# Patient Record
Sex: Male | Born: 1990 | Race: White | Hispanic: No | Marital: Married | Smoking: Current every day smoker
Health system: Southern US, Community
[De-identification: ages and names within clinical notes are randomized; demographics above are authoritative.]

---

## 2013-12-12 DIAGNOSIS — Z8249 Family history of ischemic heart disease and other diseases of the circulatory system: Secondary | ICD-10-CM | POA: Insufficient documentation

## 2013-12-12 DIAGNOSIS — F909 Attention-deficit hyperactivity disorder, unspecified type: Secondary | ICD-10-CM | POA: Insufficient documentation

## 2013-12-12 DIAGNOSIS — Z801 Family history of malignant neoplasm of trachea, bronchus and lung: Secondary | ICD-10-CM | POA: Insufficient documentation

## 2013-12-12 DIAGNOSIS — F17200 Nicotine dependence, unspecified, uncomplicated: Secondary | ICD-10-CM | POA: Insufficient documentation

## 2013-12-12 DIAGNOSIS — F32A Depression, unspecified: Secondary | ICD-10-CM | POA: Insufficient documentation

## 2018-09-17 ENCOUNTER — Other Ambulatory Visit: Payer: Self-pay

## 2018-09-17 ENCOUNTER — Emergency Department
Admission: EM | Admit: 2018-09-17 | Discharge: 2018-09-18 | Disposition: A | Discharge status: Home / Self Care | Payer: No Typology Code available for payment source | Attending: Student in an Organized Health Care Education/Training Program | Admitting: Student in an Organized Health Care Education/Training Program

## 2018-09-17 ENCOUNTER — Emergency Department: Payer: No Typology Code available for payment source

## 2018-09-17 DIAGNOSIS — Y9389 Activity, other specified: Secondary | ICD-10-CM | POA: Insufficient documentation

## 2018-09-17 DIAGNOSIS — Z23 Encounter for immunization: Secondary | ICD-10-CM | POA: Diagnosis not present

## 2018-09-17 DIAGNOSIS — S01312A Laceration without foreign body of left ear, initial encounter: Secondary | ICD-10-CM | POA: Insufficient documentation

## 2018-09-17 DIAGNOSIS — Y929 Unspecified place or not applicable: Secondary | ICD-10-CM | POA: Insufficient documentation

## 2018-09-17 DIAGNOSIS — Y999 Unspecified external cause status: Secondary | ICD-10-CM | POA: Diagnosis not present

## 2018-09-17 DIAGNOSIS — S0990XA Unspecified injury of head, initial encounter: Secondary | ICD-10-CM | POA: Insufficient documentation

## 2018-09-17 MED ORDER — TETANUS-DIPHTH-ACELL PERTUSSIS 5-2.5-18.5 LF-MCG/0.5 IM SUSP
0.5000 mL | Freq: Once | INTRAMUSCULAR | Status: AC
  Administered 2018-09-17: 0.5 mL via INTRAMUSCULAR
  Filled 2018-09-17: qty 0.5

## 2018-09-17 MED ORDER — LIDOCAINE-EPINEPHRINE-TETRACAINE (LET) SOLUTION
3.0000 mL | Freq: Once | NASAL | Status: AC
  Administered 2018-09-17: 3 mL via TOPICAL
  Filled 2018-09-17: qty 3

## 2018-09-17 MED ORDER — BUPIVACAINE HCL (PF) 0.5 % IJ SOLN
INTRAMUSCULAR | Status: AC
  Filled 2018-09-17: qty 30
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
## 2018-09-17 NOTE — ED Notes (Signed)
Pt returned to ED Rm 8 from CT at this time. C-collar remains in place.
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
## 2018-09-18 MED ORDER — NAPROXEN 500 MG PO TABS
500.0000 mg | ORAL_TABLET | Freq: Once | ORAL | Status: AC
  Administered 2018-09-18: 500 mg via ORAL
  Filled 2018-09-18: qty 1

## 2018-09-18 MED ORDER — LEVOFLOXACIN 500 MG PO TABS: 500.0000 mg | ORAL_TABLET | Freq: Every day | ORAL | 0 refills | Status: AC

## 2018-09-18 MED ORDER — HYDROCODONE-ACETAMINOPHEN 5-325 MG PO TABS
1.0000 | ORAL_TABLET | Freq: Once | ORAL | Status: AC
  Administered 2018-09-18: 1 via ORAL
  Filled 2018-09-18: qty 1

## 2018-09-18 MED ORDER — BUTALBITAL-APAP-CAFFEINE 50-325-40 MG PO TABS: 1.0000 | ORAL_TABLET | Freq: Four times a day (QID) | ORAL | 0 refills | Status: DC | PRN

## 2018-11-13 ENCOUNTER — Emergency Department (HOSPITAL_COMMUNITY): Payer: Self-pay

## 2018-11-13 ENCOUNTER — Other Ambulatory Visit: Payer: Self-pay

## 2018-11-13 ENCOUNTER — Emergency Department (HOSPITAL_COMMUNITY)
Admission: EM | Admit: 2018-11-13 | Discharge: 2018-11-13 | Disposition: A | Discharge status: Home / Self Care | Payer: Self-pay | Attending: Emergency Medicine | Admitting: Emergency Medicine

## 2018-11-13 DIAGNOSIS — J019 Acute sinusitis, unspecified: Secondary | ICD-10-CM | POA: Insufficient documentation

## 2018-11-13 LAB — MONONUCLEOSIS SCREEN: MONO SCREEN: NEGATIVE

## 2018-11-13 MED ORDER — AMOXICILLIN 500 MG PO CAPS: 500.0000 mg | ORAL_CAPSULE | Freq: Three times a day (TID) | ORAL | 0 refills | Status: DC

## 2018-11-13 NOTE — Discharge Instructions (Signed)
Return if any problems.

## 2018-11-13 NOTE — ED Triage Notes (Signed)
Pt presents to ED for evaluation of cough, fever, excessive sweating, nasal congestion, and back pain x 2 weeks. Been around others with similar symptoms recently.

## 2018-11-13 NOTE — ED Notes (Signed)
Patient verbalized understanding of discharge instructions and denies any further needs or questions at this time. VS stable. Patient ambulatory with steady gait.  

## 2018-11-13 NOTE — ED Provider Notes (Signed)
[facility] EMERGENCY DEPARTMENT Provider Note   CSN: [id number] Arrival date & time: 11/13/18  1439     History   Chief Complaint No chief complaint on file.   HPI Randy Huff is a 28 y.o. male.  The history is provided by the patient. No language interpreter was used.  Sore Throat  This is a new problem. The problem occurs constantly. The problem has been gradually worsening. Pertinent negatives include no chest pain. Nothing aggravates the symptoms. Nothing relieves the symptoms. He has tried nothing for the symptoms. The treatment provided no relief.    No past medical history on file.  There are no active problems to display for this patient.   No past surgical history on file.      Home Medications    Prior to Admission medications   Medication Sig Start Date End Date Taking? Authorizing Provider  butalbital-acetaminophen-caffeine (FIORICET, ESGIC) 50-325-40 MG tablet Take 1 tablet by mouth every 6 (six) hours as needed for headache. 09/18/18 09/18/19  Willy Eddy, MD    Family History No family history on file.  Social History Social History   Tobacco Use  . Smoking status: Unknown If Ever Smoked  Substance Use Topics  . Alcohol use: Not on file  . Drug use: Not on file     Allergies   Patient has no known allergies.   Review of Systems Review of Systems  Cardiovascular: Negative for chest pain.  All other systems reviewed and are negative.    Physical Exam Updated Vital Signs BP 115/72 (BP Location: Right Arm)   Pulse (!) 111   Temp 99.1 F (37.3 C) (Oral)   Resp 16   Ht 6\' 1"  (1.854 m)   Wt 93 kg   SpO2 98%   BMI 27.05 kg/m   Physical Exam Vitals signs and nursing note reviewed.  Constitutional:      Appearance: He is well-developed.  HENT:     Head: Normocephalic and atraumatic.     Right Ear: Tympanic membrane normal.     Left Ear: Tympanic membrane normal.     Nose: Nose normal.     Mouth/Throat:      Comments: Erythema throat, no exudate Eyes:     Conjunctiva/sclera: Conjunctivae normal.  Neck:     Musculoskeletal: Normal range of motion and neck supple.  Cardiovascular:     Rate and Rhythm: Normal rate and regular rhythm.     Heart sounds: No murmur.  Pulmonary:     Effort: Pulmonary effort is normal. No respiratory distress.     Breath sounds: Normal breath sounds.  Abdominal:     Palpations: Abdomen is soft.     Tenderness: There is no abdominal tenderness.  Musculoskeletal: Normal range of motion.  Skin:    General: Skin is warm and dry.  Neurological:     General: No focal deficit present.     Mental Status: He is alert.  Psychiatric:        Mood and Affect: Mood normal.      ED Treatments / Results  Labs (all labs ordered are listed, but only abnormal results are displayed) Labs Reviewed  MONONUCLEOSIS SCREEN    EKG None  Radiology Dg Chest 2 View  Result Date: 11/13/2018 CLINICAL DATA:  Cough.  Fever. EXAM: CHEST - 2 VIEW COMPARISON:  09/17/2018. FINDINGS: Mediastinum hilar structures normal. Lungs are clear. No pleural effusion or pneumothorax. Heart size normal. No acute bony abnormality. IMPRESSION: No acute cardiopulmonary  disease. Electronically Signed   By: Maisie Fus  Register   On: 11/13/2018 15:58    Procedures Procedures (including critical care time)  Medications Ordered in ED Medications - No data to display   Initial Impression / Assessment and Plan / ED Course  I have reviewed the triage vital signs and the nursing notes.  Pertinent labs & imaging results that were available during my care of the patient were reviewed by me and considered in my medical decision making (see chart for details).       Final Clinical Impressions(s) / ED Diagnoses   Final diagnoses:  Acute sinusitis, recurrence not specified, unspecified location    ED Discharge Orders         Ordered    amoxicillin (AMOXIL) 500 MG capsule  3 times daily      11/13/18 1726        An After Visit Summary was printed and given to the patient.    Osie Cheeks 11/13/18 1727    Wynetta Fines, MD 11/13/18 [name]

## 2019-01-03 ENCOUNTER — Other Ambulatory Visit: Payer: Self-pay

## 2019-01-03 ENCOUNTER — Emergency Department (HOSPITAL_COMMUNITY)
Admission: EM | Admit: 2019-01-03 | Discharge: 2019-01-03 | Disposition: A | Discharge status: Home / Self Care | Payer: Self-pay | Attending: Emergency Medicine | Admitting: Emergency Medicine

## 2019-01-03 DIAGNOSIS — Z79899 Other long term (current) drug therapy: Secondary | ICD-10-CM | POA: Insufficient documentation

## 2019-01-03 DIAGNOSIS — M545 Low back pain, unspecified: Secondary | ICD-10-CM

## 2019-01-03 LAB — URINALYSIS, ROUTINE W REFLEX MICROSCOPIC
BILIRUBIN URINE: NEGATIVE
Glucose, UA: NEGATIVE mg/dL
HGB URINE DIPSTICK: NEGATIVE
KETONES UR: NEGATIVE mg/dL
Leukocytes,Ua: NEGATIVE
NITRITE: NEGATIVE
PH: 7 (ref 5.0–8.0)
Protein, ur: NEGATIVE mg/dL
SPECIFIC GRAVITY, URINE: 1.027 (ref 1.005–1.030)

## 2019-01-03 MED ORDER — PREDNISONE 20 MG PO TABS: 40.0000 mg | ORAL_TABLET | Freq: Every day | ORAL | 0 refills | Status: DC

## 2019-01-03 MED ORDER — KETOROLAC TROMETHAMINE 30 MG/ML IJ SOLN
30.0000 mg | Freq: Once | INTRAMUSCULAR | Status: DC
  Filled 2019-01-03: qty 1

## 2019-01-03 MED ORDER — FENTANYL CITRATE (PF) 100 MCG/2ML IJ SOLN
100.0000 ug | Freq: Once | INTRAMUSCULAR | Status: AC
  Administered 2019-01-03: 100 ug via INTRAVENOUS

## 2019-01-03 MED ORDER — FENTANYL CITRATE (PF) 100 MCG/2ML IJ SOLN
100.0000 ug | Freq: Once | INTRAMUSCULAR | Status: DC
  Filled 2019-01-03: qty 2

## 2019-01-03 MED ORDER — METHOCARBAMOL 500 MG PO TABS: 500.0000 mg | ORAL_TABLET | Freq: Three times a day (TID) | ORAL | 0 refills | Status: DC | PRN

## 2019-01-03 MED ORDER — KETOROLAC TROMETHAMINE 15 MG/ML IJ SOLN
30.0000 mg | Freq: Once | INTRAMUSCULAR | Status: AC
  Administered 2019-01-03: 30 mg via INTRAVENOUS
  Filled 2019-01-03: qty 2

## 2019-01-03 NOTE — ED Provider Notes (Signed)
[facility] EMERGENCY DEPARTMENT Provider Note   CSN: [id number] Arrival date & time: 01/03/19  0941    History   Chief Complaint Chief Complaint  Patient presents with  . Testicle Pain    HPI Randy Huff is a 28 y.o. male.     HPI Patient presents with low back pain going to both testicles.  Began around 1 in the morning.  Around 2 days ago he shoveled some mulch.  Has history of chronic back pain.  Worse with laying flat.  No numbness weakness.  No fevers.  No trauma.  Has not had pain this severe before.  No dysuria.  No perineal pain.  No penile discharge.  Denies chance of STD. No past medical history on file.  There are no active problems to display for this patient.   No past surgical history on file.      Home Medications    Prior to Admission medications   Medication Sig Start Date End Date Taking? Authorizing Provider  acetaminophen (TYLENOL) 500 MG tablet Take 1,000 mg by mouth every 6 (six) hours as needed for mild pain or moderate pain.   Yes [provider]  amoxicillin (AMOXIL) 500 MG capsule Take 1 capsule (500 mg total) by mouth 3 (three) times daily. Patient not taking: Reported on 01/03/2019 11/13/18   Elson Areas, PA-C  butalbital-acetaminophen-caffeine (FIORICET, ESGIC) [phone] MG tablet Take 1 tablet by mouth every 6 (six) hours as needed for headache. Patient not taking: Reported on 01/03/2019 09/18/18 09/18/19  Willy Eddy, MD  methocarbamol (ROBAXIN) 500 MG tablet Take 1 tablet (500 mg total) by mouth every 8 (eight) hours as needed for muscle spasms. 01/03/19   Benjiman Core, MD  predniSONE (DELTASONE) 20 MG tablet Take 2 tablets (40 mg total) by mouth daily. 01/03/19   Benjiman Core, MD    Family History No family history on file.  Social History Social History   Tobacco Use  . Smoking status: Unknown If Ever Smoked  Substance Use Topics  . Alcohol use: Not on file  . Drug use: Not on file      Allergies   Patient has no known allergies.   Review of Systems Review of Systems  Constitutional: Negative for appetite change.  HENT: Negative for congestion.   Respiratory: Negative for shortness of breath.   Gastrointestinal: Negative for abdominal pain.  Genitourinary: Positive for testicular pain. Negative for difficulty urinating.  Musculoskeletal: Positive for back pain.  Neurological: Negative for weakness.  Hematological: Negative for adenopathy.  Psychiatric/Behavioral: Negative for confusion.     Physical Exam Updated Vital Signs BP 127/77   Pulse 78   Temp 98 F (36.7 C) (Oral)   Resp 17   Ht 6' (1.829 m)   Wt 93 kg   SpO2 99%   BMI 27.80 kg/m   Physical Exam Vitals signs and nursing note reviewed.  HENT:     Head: Atraumatic.     Mouth/Throat:     Mouth: Mucous membranes are moist.  Eyes:     Extraocular Movements: Extraocular movements intact.  Neck:     Musculoskeletal: Neck supple.  Pulmonary:     Effort: Pulmonary effort is normal.  Abdominal:     Tenderness: There is no abdominal tenderness.  Musculoskeletal:     Comments: Good straight leg raise bilaterally.  Strength sensation intact in both lower legs.  Perineal sensation intact.  Some tenderness over lower lumbar spine and SI areas.  Skin:  General: Skin is warm.     Capillary Refill: Capillary refill takes less than 2 seconds.  Neurological:     General: No focal deficit present.     Mental Status: He is alert.  Psychiatric:        Mood and Affect: Mood normal.      ED Treatments / Results  Labs (all labs ordered are listed, but only abnormal results are displayed) Labs Reviewed  URINALYSIS, ROUTINE W REFLEX MICROSCOPIC - Abnormal; Notable for the following components:      Result Value   APPearance CLOUDY (*)    All other components within normal limits    EKG None  Radiology No results found.  Procedures Procedures (including critical care  time)  Medications Ordered in ED Medications  fentaNYL (SUBLIMAZE) injection 100 mcg (100 mcg Intravenous Given 01/03/19 1033)  ketorolac (TORADOL) 15 MG/ML injection 30 mg (30 mg Intravenous Given 01/03/19 1036)     Initial Impression / Assessment and Plan / ED Course  I have reviewed the triage vital signs and the nursing notes.  Pertinent labs & imaging results that were available during my care of the patient were reviewed by me and considered in my medical decision making (see chart for details).       Patient presents with back pain.  Has a history of chronic back pain but this is worse.  No red flags.  No fevers.  Perineal sensation intact.  Urine does not show infection or blood.  Feels better after treatment.  Will treat symptomatically with neurosurgery follow-up as needed.  Final Clinical Impressions(s) / ED Diagnoses   Final diagnoses:  Bilateral low back pain without sciatica, unspecified chronicity    ED Discharge Orders         Ordered    methocarbamol (ROBAXIN) 500 MG tablet  Every 8 hours PRN     01/03/19 1246    predniSONE (DELTASONE) 20 MG tablet  Daily     01/03/19 1246           Benjiman Core, MD 01/03/19 1307

## 2019-01-03 NOTE — ED Notes (Signed)
Patient verbalizes understanding of discharge instructions. Opportunity for questioning and answering were provided.  patient discharged from ED.  

## 2019-01-03 NOTE — ED Triage Notes (Signed)
p c/o bilateral testicular pain that began around 1 am today ; pt denies any trauma or denies any pain with urination ; pt also c/o back pain ; pt states he has chronic back pain and usually takes tylenol for the pain but this time around his pain does not go away

## 2019-01-23 ENCOUNTER — Emergency Department
Admission: EM | Admit: 2019-01-23 | Discharge: 2019-01-23 | Disposition: A | Discharge status: Home / Self Care | Payer: Self-pay | Attending: Emergency Medicine | Admitting: Emergency Medicine

## 2019-01-23 ENCOUNTER — Encounter: Payer: Self-pay | Admitting: Emergency Medicine

## 2019-01-23 ENCOUNTER — Other Ambulatory Visit: Payer: Self-pay

## 2019-01-23 DIAGNOSIS — J329 Chronic sinusitis, unspecified: Secondary | ICD-10-CM | POA: Insufficient documentation

## 2019-01-23 DIAGNOSIS — F17200 Nicotine dependence, unspecified, uncomplicated: Secondary | ICD-10-CM | POA: Insufficient documentation

## 2019-01-23 DIAGNOSIS — B9689 Other specified bacterial agents as the cause of diseases classified elsewhere: Secondary | ICD-10-CM

## 2019-01-23 MED ORDER — AMOXICILLIN-POT CLAVULANATE 875-125 MG PO TABS
1.0000 | ORAL_TABLET | Freq: Once | ORAL | Status: AC
  Administered 2019-01-23: 1 via ORAL
  Filled 2019-01-23: qty 1

## 2019-01-23 MED ORDER — AMOXICILLIN-POT CLAVULANATE 875-125 MG PO TABS: 1.0000 | ORAL_TABLET | Freq: Two times a day (BID) | ORAL | 0 refills | Status: DC

## 2019-01-23 NOTE — ED Provider Notes (Signed)
[facility] Emergency Department Provider Note  ____________________________________________  Time seen: Approximately 6:08 PM  I have reviewed the triage vital signs and the nursing notes.   HISTORY  Chief Complaint Facial Pain    HPI Randy Huff is a 28 y.o. male who presents the emergency department complaining of 3-day history of increasing nasal congestion, sinus pressure, purulent nasal drainage.  Patient states that he has had sinus infections in the past and this feels the same.  He describes a lot of pressure to the left maxillary sinus.  Patient has been using over-the-counter medications to include Tylenol for pain, Mucinex for congestion relief, and an unnamed nasal spray.  No significant relief on over-the-counter medications.  Patient denies any fevers or chills, cough, shortness of breath abdominal pain, nausea or vomiting.         History reviewed. No pertinent past medical history.  There are no active problems to display for this patient.   History reviewed. No pertinent surgical history.  Prior to Admission medications   Medication Sig Start Date End Date Taking? Authorizing Provider  acetaminophen (TYLENOL) 500 MG tablet Take 1,000 mg by mouth every 6 (six) hours as needed for mild pain or moderate pain.    [provider]  amoxicillin-clavulanate (AUGMENTIN) 875-125 MG tablet Take 1 tablet by mouth 2 (two) times daily. 01/23/19   , Delorise Royals, PA-C    Allergies Patient has no known allergies.  History reviewed. No pertinent family history.  Social History Social History   Tobacco Use  . Smoking status: Current Every Day Smoker  . Smokeless tobacco: Never Used  Substance Use Topics  . Alcohol use: Not on file  . Drug use: Not on file     Review of Systems  Constitutional: No fever/chills Eyes: No visual changes. No discharge ENT: Positive for nasal congestion and sinus pressure. Cardiovascular: no  chest pain. Respiratory: no cough. No SOB. Gastrointestinal: No abdominal pain.  No nausea, no vomiting.  Musculoskeletal: Negative for musculoskeletal pain. Skin: Negative for rash, abrasions, lacerations, ecchymosis. Neurological: Negative for headaches, focal weakness or numbness. 10-point ROS otherwise negative.  ____________________________________________   PHYSICAL EXAM:  VITAL SIGNS: ED Triage Vitals  Enc Vitals Group     BP 01/23/19 1757 129/84     Pulse Rate 01/23/19 1757 (!) 111     Resp 01/23/19 1757 18     Temp 01/23/19 1757 98.7 F (37.1 C)     Temp Source 01/23/19 1757 Oral     SpO2 01/23/19 1757 100 %     Weight 01/23/19 1755 205 lb (93 kg)     Height 01/23/19 1755 6' (1.829 m)     Head Circumference --      Peak Flow --      Pain Score 01/23/19 1755 3     Pain Loc --      Pain Edu? --      Excl. in GC? --      Constitutional: Alert and oriented. Well appearing and in no acute distress. Eyes: Conjunctivae are normal. PERRL. EOMI. Head: Atraumatic. ENT:      Ears: EACs and TMs unremarkable bilaterally.      Nose:  significant purulent congestion/rhinnorhea.  Patient is very tender percussion of the left maxillary sinus.      Mouth/Throat: Mucous membranes are moist.  Oropharynx is nonerythematous and nonedematous.  Uvula is midline. Neck: No stridor.  No oropharyngeal edema or erythema. Hematological/Lymphatic/Immunilogical: No cervical lymphadenopathy. Cardiovascular: Normal rate, regular  rhythm. Normal S1 and S2.  Good peripheral circulation. Respiratory: Normal respiratory effort without tachypnea or retractions. Lungs CTAB. Good air entry to the bases with no decreased or absent breath sounds. Musculoskeletal: Full range of motion to all extremities. No gross deformities appreciated. Neurologic:  Normal speech and language. No gross focal neurologic deficits are appreciated.  Skin:  Skin is warm, dry and intact. No rash noted. Psychiatric: Mood and  affect are normal. Speech and behavior are normal. Patient exhibits appropriate insight and judgement.   ____________________________________________   LABS (all labs ordered are listed, but only abnormal results are displayed)  Labs Reviewed - No data to display ____________________________________________  EKG   ____________________________________________  RADIOLOGY   No results found.  ____________________________________________    PROCEDURES  Procedure(s) performed:    Procedures    Medications  amoxicillin-clavulanate (AUGMENTIN) 875-125 MG per tablet 1 tablet (has no administration in time range)     ____________________________________________   INITIAL IMPRESSION / ASSESSMENT AND PLAN / ED COURSE  Pertinent labs & imaging results that were available during my care of the patient were reviewed by me and considered in my medical decision making (see chart for details).  Review of the [place] CSRS was performed in accordance of the [facility] prior to dispensing any controlled drugs.           Patient's diagnosis is consistent with bacterial sinusitis.  Patient presented to emergency department with 3-day history of nasal congestion, sinus pressure.  Patient has had a history of recurrent sinus infections and states that this feels similar to previous infections.  Findings are consistent with sinusitis.  Patient will be started on Augmentin.  Flonase at home for additional symptom relief.  Follow-up primary care as needed.. . Patient is given ED precautions to return to the ED for any worsening or new symptoms.     ____________________________________________  FINAL CLINICAL IMPRESSION(S) / ED DIAGNOSES  Final diagnoses:  Bacterial sinusitis      NEW MEDICATIONS STARTED DURING THIS VISIT:  ED Discharge Orders         Ordered    amoxicillin-clavulanate (AUGMENTIN) 875-125 MG tablet  2 times daily     01/23/19 1816              This chart  was dictated using voice recognition software/Dragon. Despite best efforts to proofread, errors can occur which can change the meaning. Any change was purely unintentional.    Racheal Patches, PA-C 01/23/19 1820    Rockne Menghini, MD 01/23/19 2224

## 2019-01-23 NOTE — ED Notes (Signed)
See triage note  Presents with sinus pressure   Has green nasal drainage  sxs' started about 3 days ago afebrile on arrival

## 2019-01-23 NOTE — ED Triage Notes (Signed)
Here for left maxillary sinus pressure for 3 days. Green nasal drainage, posterior and anterior. Reports drainage has foul odor and smell.  Pt thinks may have sinus infection.  Mask given.

## 2019-06-05 ENCOUNTER — Emergency Department (HOSPITAL_COMMUNITY)
Admission: EM | Admit: 2019-06-05 | Discharge: 2019-06-05 | Disposition: A | Discharge status: Home / Self Care | Payer: Self-pay | Attending: Emergency Medicine | Admitting: Emergency Medicine

## 2019-06-05 ENCOUNTER — Other Ambulatory Visit: Payer: Self-pay

## 2019-06-05 ENCOUNTER — Encounter (HOSPITAL_COMMUNITY): Payer: Self-pay | Admitting: Emergency Medicine

## 2019-06-05 DIAGNOSIS — Z79899 Other long term (current) drug therapy: Secondary | ICD-10-CM | POA: Insufficient documentation

## 2019-06-05 DIAGNOSIS — L0100 Impetigo, unspecified: Secondary | ICD-10-CM | POA: Insufficient documentation

## 2019-06-05 DIAGNOSIS — F17200 Nicotine dependence, unspecified, uncomplicated: Secondary | ICD-10-CM | POA: Insufficient documentation

## 2019-06-05 MED ORDER — MUPIROCIN 2 % EX OINT: 1.0000 "application " | TOPICAL_OINTMENT | Freq: Three times a day (TID) | CUTANEOUS | 0 refills | Status: AC

## 2019-06-05 MED ORDER — SULFAMETHOXAZOLE-TRIMETHOPRIM 800-160 MG PO TABS: 1.0000 | ORAL_TABLET | Freq: Two times a day (BID) | ORAL | 0 refills | Status: AC

## 2019-06-05 NOTE — ED Provider Notes (Signed)
[facility] Provider Note   CSN: [id number] Arrival date & time: 06/05/19  1901    History   Chief Complaint Chief Complaint  Patient presents with  . Rash    HPI Rance MuirKevin Alvizo is a 28 y.o. male.     HPI   Rance MuirKevin Furgason is a 28 y.o. male, patient with no pertinent past medical history, presenting to the ED with a rash under the the arms bilaterally beginning about 3 days ago. He notes some burning with the lesions, especially with use of deodorant.  He states some of the lesions at one point began to look like blisters and then drained clear fluid.  His children were diagnosed with impetigo this morning and placed on antibiotics.  Patient was advised at that time that he would need to be placed on antibiotics as well as his rash looked similar.  Denies history of IVDA, immunocompromise, or MRSA. Denies fever/chills, lymphadenopathy, nausea/vomiting, myalgias, arthralgias, neck pain/stiffness, or any other complaints.   History reviewed. No pertinent past medical history.  There are no active problems to display for this patient.   History reviewed. No pertinent surgical history.      Home Medications    Prior to Admission medications   Medication Sig Start Date End Date Taking? Authorizing Provider  acetaminophen (TYLENOL) 500 MG tablet Take 1,000 mg by mouth every 6 (six) hours as needed for mild pain or moderate pain.    [provider]  mupirocin ointment (BACTROBAN) 2 % Apply 1 application topically 3 (three) times daily for 5 days. 06/05/19 06/10/19  ,  C, PA-C  sulfamethoxazole-trimethoprim (BACTRIM DS) 800-160 MG tablet Take 1 tablet by mouth 2 (two) times daily for 7 days. 06/05/19 06/12/19  Anselm Pancoast,  C, PA-C    Family History No family history on file.  Social History Social History   Tobacco Use  . Smoking status: Current Every Day Smoker  . Smokeless tobacco: Never Used  Substance Use Topics  .  Alcohol use: Not on file  . Drug use: Not on file     Allergies   Patient has no known allergies.   Review of Systems Review of Systems  Constitutional: Negative for fever.  Gastrointestinal: Negative for nausea and vomiting.  Musculoskeletal: Negative for arthralgias, myalgias, neck pain and neck stiffness.  Skin: Positive for rash.     Physical Exam Updated Vital Signs BP 123/74 (BP Location: Right Arm)   Pulse 77   Temp 97.7 F (36.5 C) (Oral)   Resp 16   SpO2 100%   Physical Exam Vitals signs and nursing note reviewed.  Constitutional:      General: He is not in acute distress.    Appearance: He is well-developed. He is not diaphoretic.  HENT:     Head: Normocephalic and atraumatic.  Eyes:     Conjunctiva/sclera: Conjunctivae normal.  Neck:     Musculoskeletal: Neck supple.  Cardiovascular:     Rate and Rhythm: Normal rate and regular rhythm.  Pulmonary:     Effort: Pulmonary effort is normal.  Lymphadenopathy:     Upper Body:     Right upper body: No axillary adenopathy.     Left upper body: No axillary adenopathy.  Skin:    General: Skin is warm and dry.     Coloration: Skin is not pale.     Comments: Individual crusted lesions with erythematous base noted to the underarms bilaterally.  No noted tenderness.  No vesicles or  pustules noted.  Neurological:     Mental Status: He is alert.  Psychiatric:        Behavior: Behavior normal.                   ED Treatments / Results  Labs (all labs ordered are listed, but only abnormal results are displayed) Labs Reviewed - No data to display  EKG None  Radiology No results found.  Procedures Procedures (including critical care time)  Medications Ordered in ED Medications - No data to display   Initial Impression / Assessment and Plan / ED Course  I have reviewed the triage vital signs and the nursing notes.  Pertinent labs & imaging results that were available during my care of  the patient were reviewed by me and considered in my medical decision making (see chart for details).        Patient presents with rash on the arms for the last 3 days.  Impetigo is definitely a consideration especially since patient's children were diagnosed with impetigo and have similar lesions.  Based on location, folliculitis is also a consideration. No signs of systemic illness or sepsis.  Antibiotic therapy initiated. The patient was given instructions for home care as well as return precautions. Patient voices understanding of these instructions, accepts the plan, and is comfortable with discharge.  Final Clinical Impressions(s) / ED Diagnoses   Final diagnoses:  Impetigo    ED Discharge Orders         Ordered    mupirocin ointment (BACTROBAN) 2 %  3 times daily     06/05/19 2202    sulfamethoxazole-trimethoprim (BACTRIM DS) 800-160 MG tablet  2 times daily     06/05/19 2202           Lorayne Bender, PA-C 06/06/19 [place], Dan, DO 06/06/19 1527

## 2019-06-05 NOTE — ED Triage Notes (Signed)
Patient here with skin infection in right axilla.  He states that his kids have an infections and he was wondering if it is the same this.  They were fluid filled and now they have crusted over and dry.  He states he was putting powder and a cream on it for the last two days.

## 2019-06-05 NOTE — Discharge Instructions (Addendum)
°  Apply the mupirocin ointment 3 times a day for 5 days. Please take all of your oral antibiotics until finished!   You may develop abdominal discomfort or diarrhea from the antibiotic.  You may help offset this with probiotics which you can buy or get in yogurt. Do not eat or take the probiotics until 2 hours after your antibiotic.  Cleaning: Clean the wound and surrounding area gently with tap water and mild soap. Rinse well and blot dry. You may shower normally.   Clean the wounds daily to prevent further infection. Do not use cleaners such as hydrogen peroxide or alcohol.   Scar reduction: Application of a topical antibiotic ointment, such as Neosporin, after the wound has begun to close and heal well can decrease scab formation and reduce scarring. After the wound has healed, application of ointments such as Aquaphor can also reduce scar formation.  The key to scar reduction is keeping the skin well hydrated and supple. Drinking plenty of water throughout the day (At least eight 8oz glasses of water a day) is essential to staying well hydrated.  Sun exposure: Keep the wound out of the sun. After the wound has healed, continue to protect it from the sun by wearing protective clothing or applying sunscreen.  Pain: You may use Tylenol, naproxen, or ibuprofen for pain.  Prevention: These infections form because bacteria that naturally lives on the skin gets trapped underneath the skin.  This can occur through openings too small to see. Before and after any area of skin is shaved, wax, or abraded in any manner, the area should be washed with soap and water and rinsed well.   If you are having trouble with recurrent abscesses, it may be wise to perform a chlorhexidine wash regimen.  For 1 week, wash all of your body with chlorhexidine (available over-the-counter at most pharmacies). You may also need to reevaluate your use of daily soap as soaps with perfumes or dyes can increase the chances of  infection in some people.  Follow up: Please return to the ED or go to your primary care provider in 2-3 days for a wound check to assure proper healing.  Return: Return to the ED sooner should signs of worsening infection arise, such as spreading redness, worsening puffiness/swelling, severe increase in pain, fever over 100.40F, or any other major issues.  For prescription assistance, may try using prescription discount sites or apps, such as goodrx.com

## 2019-06-05 NOTE — ED Notes (Signed)
Patient verbalizes understanding of discharge instructions. Opportunity for questioning and answers were provided. pt discharged from ED. Ambulatory by self  

## 2019-09-07 ENCOUNTER — Other Ambulatory Visit: Payer: Self-pay

## 2019-09-07 ENCOUNTER — Emergency Department (HOSPITAL_COMMUNITY)
Admission: EM | Admit: 2019-09-07 | Discharge: 2019-09-07 | Disposition: A | Discharge status: Home / Self Care | Payer: Self-pay | Attending: Emergency Medicine | Admitting: Emergency Medicine

## 2019-09-07 ENCOUNTER — Emergency Department (HOSPITAL_COMMUNITY): Payer: Self-pay

## 2019-09-07 ENCOUNTER — Encounter (HOSPITAL_COMMUNITY): Payer: Self-pay | Admitting: Emergency Medicine

## 2019-09-07 DIAGNOSIS — F17200 Nicotine dependence, unspecified, uncomplicated: Secondary | ICD-10-CM | POA: Insufficient documentation

## 2019-09-07 DIAGNOSIS — X58XXXA Exposure to other specified factors, initial encounter: Secondary | ICD-10-CM | POA: Insufficient documentation

## 2019-09-07 DIAGNOSIS — T1591XA Foreign body on external eye, part unspecified, right eye, initial encounter: Secondary | ICD-10-CM

## 2019-09-07 DIAGNOSIS — Y990 Civilian activity done for income or pay: Secondary | ICD-10-CM | POA: Insufficient documentation

## 2019-09-07 DIAGNOSIS — Y939 Activity, unspecified: Secondary | ICD-10-CM | POA: Insufficient documentation

## 2019-09-07 DIAGNOSIS — T1501XA Foreign body in cornea, right eye, initial encounter: Secondary | ICD-10-CM | POA: Insufficient documentation

## 2019-09-07 DIAGNOSIS — Y929 Unspecified place or not applicable: Secondary | ICD-10-CM | POA: Insufficient documentation

## 2019-09-07 MED ORDER — TETRACAINE HCL 0.5 % OP SOLN
2.0000 [drp] | Freq: Once | OPHTHALMIC | Status: AC
  Administered 2019-09-07: 2 [drp] via OPHTHALMIC
  Filled 2019-09-07: qty 4

## 2019-09-07 MED ORDER — FLUORESCEIN SODIUM 1 MG OP STRP
1.0000 | ORAL_STRIP | Freq: Once | OPHTHALMIC | Status: AC
  Administered 2019-09-07: 1 via OPHTHALMIC
  Filled 2019-09-07: qty 1

## 2019-09-07 MED ORDER — ERYTHROMYCIN 5 MG/GM OP OINT: TOPICAL_OINTMENT | OPHTHALMIC | 0 refills | Status: AC

## 2019-09-07 NOTE — ED Notes (Signed)
Patient transported to CT 

## 2019-09-07 NOTE — ED Triage Notes (Signed)
Patient c/o right eye pain and redness x 2 days. States it started with a metal shaving in his eye. Reports blurriness to eye. Yesterday had light sensitivity but not as severe today.

## 2019-09-07 NOTE — ED Provider Notes (Signed)
[facility] EMERGENCY DEPARTMENT Provider Note   CSN: [id number] Arrival date & time: 09/07/19  1354     History   Chief Complaint Chief Complaint  Patient presents with  . Eye Pain    HPI Randy Huff is a 28 y.o. male presents to emergency department today with chief complaint of right eye pain x4 days.  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  He has had tearing and yellow drainage from his eye.  He reports light sensitivity that has improved since onset.  He felt like he had a foreign body in his eye the day of symptom onset but that feeling has also gone away.  He does have blurry vision in right eye.  He has not taken any medications for his symptoms prior to arrival but has washed his eye out multiple times with water.  Patient does not wear contacts.  He denies fever, chills. Reports tetanus is up to date.     History reviewed. No pertinent past medical history.  There are no active problems to display for this patient.   History reviewed. No pertinent surgical history.      Home Medications    Prior to Admission medications   Medication Sig Start Date End Date Taking? Authorizing Provider  acetaminophen (TYLENOL) 500 MG tablet Take 1,000 mg by mouth every 6 (six) hours as needed for mild pain or moderate pain.    [provider]  erythromycin ophthalmic ointment Place a 1/2 inch ribbon of ointment into right the lower eyelid daily 09/07/19   Albrizze, Caroleen Hamman, PA-C    Family History No family history on file.  Social History Social History   Tobacco Use  . Smoking status: Current Every Day Smoker  . Smokeless tobacco: Never Used  Substance Use Topics  . Alcohol use: Not on file  . Drug use: Not on file     Allergies   Patient has no known allergies.   Review of Systems Review of Systems  Constitutional: Negative  for chills and fever.  HENT: Negative for congestion, facial swelling and sinus pain.   Eyes: Positive for photophobia, pain, discharge and redness. Negative for itching and visual disturbance.  Respiratory: Negative for cough.   Cardiovascular: Negative for chest pain.  Gastrointestinal: Negative for abdominal pain, nausea and vomiting.  Skin: Negative for wound.  Allergic/Immunologic: Negative for immunocompromised state.     Physical Exam Updated Vital Signs BP (!) 147/82   Pulse 93   Temp 98.6 F (37 C) (Oral)   Resp 16   Ht 6' (1.829 m)   Wt 86.2 kg   SpO2 99%   BMI 25.77 kg/m   Physical Exam Vitals signs and nursing note reviewed.  Constitutional:      Appearance: He is well-developed. He is not ill-appearing or toxic-appearing.  HENT:     Head: Normocephalic and atraumatic.     Nose: Nose normal.  Eyes:     General: Lids are normal. No scleral icterus.       Right eye: Foreign body (Foreign body on lateral aspect of right cornea.) present. No discharge.        Left eye: No discharge.     Extraocular Movements: Extraocular movements intact.     Right eye: Normal extraocular motion.     Conjunctiva/sclera:     Right eye: Right  conjunctiva is injected. No hemorrhage.    Comments: Tetracaine and Fluorescein applied to both eyes.  Evaluation by Sherral Hammers lamp revealed no evidence of corneal abrasion/fuller seen uptake.  No dendritic lesions.  Negative Seidel sign.   Neck:     Musculoskeletal: Normal range of motion.     Vascular: No JVD.  Cardiovascular:     Rate and Rhythm: Normal rate and regular rhythm.     Pulses: Normal pulses.     Heart sounds: Normal heart sounds.  Pulmonary:     Effort: Pulmonary effort is normal.     Breath sounds: Normal breath sounds.  Abdominal:     General: There is no distension.  Musculoskeletal: Normal range of motion.  Skin:    General: Skin is warm and dry.  Neurological:     Mental Status: He is oriented to person, place, and  time.     GCS: GCS eye subscore is 4. GCS verbal subscore is 5. GCS motor subscore is 6.     Comments: Fluent speech, no facial droop.  Psychiatric:        Behavior: Behavior normal.      ED Treatments / Results  Labs (all labs ordered are listed, but only abnormal results are displayed) Labs Reviewed - No data to display  EKG None  Radiology Ct Orbits Wo Contrast  Result Date: 09/07/2019 CLINICAL DATA:  Foreign body on right external eye, evaluate for metal shavings in eye or orbit EXAM: CT ORBITS WITHOUT CONTRAST TECHNIQUE: Multidetector CT images were obtained using the standard protocol without intravenous contrast. COMPARISON:  None. FINDINGS: Orbits: No traumatic or inflammatory finding. Globes, optic nerves, orbital fat, extraocular muscles, vascular structures, and lacrimal glands are normal. Visualized sinuses: Clear. Soft tissues: Negative. Limited intracranial: No significant or unexpected finding. IMPRESSION: No CT abnormality of the orbits or globes. No radiopaque foreign body identified. The lower limit of CT sensitivity for metallic foreign body is approximately 0.5 mm. Electronically Signed   By: Eddie Candle M.D.   On: 09/07/2019 15:21    Procedures Procedures (including critical care time)  Medications Ordered in ED Medications  tetracaine (PONTOCAINE) 0.5 % ophthalmic solution 2 drop (2 drops Both Eyes Given 09/07/19 1536)  fluorescein ophthalmic strip 1 strip (1 strip Both Eyes Given 09/07/19 1536)     Initial Impression / Assessment and Plan / ED Course  I have reviewed the triage vital signs and the nursing notes.  Pertinent labs & imaging results that were available during my care of the patient were reviewed by me and considered in my medical decision making (see chart for details).   Patient presents with eye pain. There is no fluorescein update on exam, no indication of corneal abrasion/ulceration or HSV. Pupil is not irregular and negative Seidel sign.  Patient is afebrile and without proptosis, entrapment, or consensual photophobia, no periorbital swelling- doubt periorbital or orbital cellulitis. CT orbits negative for acute findings. No significant visual acuity deficit.  On reassessment I am able to see small pieve of metal on right cornea. I attempted to remove with q tip. I was able to remove a piece of the metal, but not the entire foreign body.   I had a page out for ophthalmology but did not receive a call back before patient was telling RN he has to leave immediately because of family emergency and he cannot wait for ophthalmology consult. Will discharge with prescription for erythromycin ointment and urged importance of ophthalmology evaluaition, given info for on call  ophthalmology provider. Very strict return precautions discussed.  On call ophthalmologist Dr. Sherrine MaplesGlenn called back after had left the department. I discussed case with Dr. Sherrine MaplesGlenn who agrees to see patient for outpatient follow up on Monday and will contact patient, pt's phone number given.    Portions of this note were generated with Scientist, clinical (histocompatibility and immunogenetics)Dragon dictation software. Dictation errors may occur despite best attempts at proofreading.    Final Clinical Impressions(s) / ED Diagnoses   Final diagnoses:  Foreign body of right eye, initial encounter    ED Discharge Orders         Ordered    erythromycin ophthalmic ointment     09/07/19 1728           Albrizze, Caroleen HammanKaitlyn E, PA-C 09/07/19 2251    Pricilla LovelessGoldston, Scott, MD 09/08/19 1459

## 2019-09-07 NOTE — Discharge Instructions (Signed)
You have been seen today for metal in your eye. Please read and follow all provided instructions. Return to the emergency room for worsening condition or new concerning symptoms including visual changes or worsening pain.  1. Medications:  Prescription to your pharmacy for erythromycin.  This is an antibiotic ointment you need to put in your eye daily.  2. Treatment: rest, drink plenty of fluids.   3. Follow Up: Very important that you follow-up with the eye doctor Dr. Eulas Post.  I have provided his contact information for you.  Please call his office first thing Monday morning to schedule a follow-up appointment.  Because you still have a piece of metal in your eye you need to see the eye doctor for further evaluation.  It is also a possibility that you have an allergic reaction to any of the medicines that you have been prescribed - Everybody reacts differently to medications and while MOST people have no trouble with most medicines, you may have a reaction such as nausea, vomiting, rash, swelling, shortness of breath. If this is the case, please stop taking the medicine immediately and contact your physician.  ?

## 2020-06-02 ENCOUNTER — Emergency Department (HOSPITAL_COMMUNITY): Payer: Medicaid Other

## 2020-06-02 ENCOUNTER — Emergency Department (HOSPITAL_COMMUNITY)
Admission: EM | Admit: 2020-06-02 | Discharge: 2020-06-03 | Disposition: A | Discharge status: Home / Self Care | Payer: Medicaid Other | Attending: Emergency Medicine | Admitting: Emergency Medicine

## 2020-06-02 ENCOUNTER — Emergency Department (HOSPITAL_COMMUNITY)
Admission: EM | Admit: 2020-06-02 | Discharge: 2020-06-02 | Disposition: A | Discharge status: Home / Self Care | Payer: Medicaid Other | Attending: Emergency Medicine | Admitting: Emergency Medicine

## 2020-06-02 ENCOUNTER — Encounter (HOSPITAL_COMMUNITY): Payer: Self-pay | Admitting: Emergency Medicine

## 2020-06-02 ENCOUNTER — Other Ambulatory Visit: Payer: Self-pay

## 2020-06-02 DIAGNOSIS — Z5321 Procedure and treatment not carried out due to patient leaving prior to being seen by health care provider: Secondary | ICD-10-CM | POA: Insufficient documentation

## 2020-06-02 DIAGNOSIS — M25562 Pain in left knee: Secondary | ICD-10-CM | POA: Insufficient documentation

## 2020-06-02 NOTE — ED Triage Notes (Signed)
Patient reports L knee pain X2 days. Was seen here last night but LWBS due to wait time. Results from xrays available in Epic

## 2020-06-02 NOTE — ED Notes (Signed)
Patient left without being seen.

## 2020-06-02 NOTE — ED Triage Notes (Signed)
Pt c/o left knee pain after jumping off the bed of his truck yesterday.

## 2020-06-03 NOTE — ED Notes (Signed)
Pt not responding to roll call.  

## 2020-08-05 ENCOUNTER — Encounter (HOSPITAL_COMMUNITY): Payer: Self-pay | Admitting: Emergency Medicine

## 2020-08-05 ENCOUNTER — Other Ambulatory Visit: Payer: Self-pay

## 2020-08-05 ENCOUNTER — Ambulatory Visit (INDEPENDENT_AMBULATORY_CARE_PROVIDER_SITE_OTHER): Payer: Self-pay

## 2020-08-05 ENCOUNTER — Ambulatory Visit (HOSPITAL_COMMUNITY)
Admission: EM | Admit: 2020-08-05 | Discharge: 2020-08-05 | Disposition: A | Discharge status: Home / Self Care | Payer: Self-pay | Attending: Family Medicine | Admitting: Family Medicine

## 2020-08-05 DIAGNOSIS — M79671 Pain in right foot: Secondary | ICD-10-CM

## 2020-08-05 DIAGNOSIS — S9031XA Contusion of right foot, initial encounter: Secondary | ICD-10-CM

## 2020-08-05 DIAGNOSIS — M25474 Effusion, right foot: Secondary | ICD-10-CM

## 2020-08-05 NOTE — Discharge Instructions (Addendum)
Continue taking up to 800mg  of ibuprofen every 8 hours with food.

## 2020-08-05 NOTE — ED Triage Notes (Signed)
Pt c/o right foot injury about a week ago. He states he jumped out of his truck and landed on a tree stub. Foot is swollen and hurts to put pressure on it.

## 2020-08-05 NOTE — ED Provider Notes (Signed)
[facility]   [id number] 08/05/20 Arrival Time: 1402  ASSESSMENT & PLAN:  1. Foot pain, right     I have personally viewed the imaging studies ordered this visit. No fractures appreciated.    Discharge Instructions     Continue taking up to 800mg  of ibuprofen every 8 hours with food.    Fitted with cam walker to use for next one week.  Recommend:    Follow-up Information    [facility].   Why: If worsening or failing to improve as anticipated. Contact information: [address] [place] [address]              Reviewed expectations re: course of current medical issues. Questions answered. Outlined signs and symptoms indicating need for more acute intervention. Patient verbalized understanding. After Visit Summary given.  SUBJECTIVE: History from: patient. Randy Huff is a 29 y.o. male who reports R foot pain. Approx one week. Jumped off bed of truck; immediate and persistent pain. Able to bear weight but with pain. No extremity sensation changes or weakness.    OBJECTIVE:  Vitals:   08/05/20 1550  BP: 120/71  Pulse: 86  Resp: 17  Temp: 98.2 F (36.8 C)  TempSrc: Oral  SpO2: 100%    General appearance: alert; no distress HEENT: [place]; AT Neck: supple with FROM Resp: unlabored respirations Extremities: . RLE: warm with well perfused appearance; fairly well localized moderate tenderness over right proximal 5th metacarpal; without gross deformities; swelling: minimal; bruising: none; ankle ROM: normal CV: brisk extremity capillary refill of RLE; 2+ DP pulse of RLE. Skin: warm and dry; no visible rashes Neurologic: normal sensation and strength of RLE Psychological: alert and cooperative; normal mood and affect  Imaging: DG Foot Complete Right  Result Date: 08/05/2020 CLINICAL DATA:  Right foot pain and swelling since injury a week ago. EXAM: RIGHT FOOT COMPLETE - 3+ VIEW  COMPARISON:  None. FINDINGS: There is no evidence of fracture or dislocation. There is no evidence of arthropathy or other focal bone abnormality. Soft tissues are unremarkable. IMPRESSION: Negative. Electronically Signed   By: 10/05/2020 M.D.   On: 08/05/2020 16:53      No Known Allergies  History reviewed. No pertinent past medical history. Social History   Socioeconomic History  . Marital status: Legally Separated    Spouse name: Not on file  . Number of children: Not on file  . Years of education: Not on file  . Highest education level: Not on file  Occupational History  . Not on file  Tobacco Use  . Smoking status: Current Every Day Smoker    Packs/day: 1.00    Types: Cigarettes  . Smokeless tobacco: Never Used  Vaping Use  . Vaping Use: Never used  Substance and Sexual Activity  . Alcohol use: Yes  . Drug use: Never  . Sexual activity: Not on file  Other Topics Concern  . Not on file  Social History Narrative  . Not on file   Social Determinants of Health   Financial Resource Strain:   . Difficulty of Paying Living Expenses: Not on file  Food Insecurity:   . Worried About 10/05/2020 in the Last Year: Not on file  . Ran Out of Food in the Last Year: Not on file  Transportation Needs:   . Lack of Transportation (Medical): Not on file  . Lack of Transportation (Non-Medical): Not on file  Physical Activity:   .  Days of Exercise per Week: Not on file  . Minutes of Exercise per Session: Not on file  Stress:   . Feeling of Stress : Not on file  Social Connections:   . Frequency of Communication with Friends and Family: Not on file  . Frequency of Social Gatherings with Friends and Family: Not on file  . Attends Religious Services: Not on file  . Active Member of Clubs or Organizations: Not on file  . Attends Banker Meetings: Not on file  . Marital Status: Not on file   Family History  Problem Relation Age of Onset  . Healthy Mother    . Healthy Father    History reviewed. No pertinent surgical history.    Mardella Layman, MD 08/05/20 1925

## 2020-08-05 NOTE — ED Notes (Signed)
Called x 2 no answer

## 2020-09-10 ENCOUNTER — Ambulatory Visit (HOSPITAL_COMMUNITY)
Admission: EM | Admit: 2020-09-10 | Discharge: 2020-09-10 | Discharge status: Against Medical Advice | Payer: No Payment, Other | Attending: Family | Admitting: Family

## 2020-09-10 ENCOUNTER — Ambulatory Visit (HOSPITAL_COMMUNITY)
Admission: RE | Admit: 2020-09-10 | Discharge: 2020-09-10 | Disposition: A | Discharge status: Home / Self Care | Payer: Self-pay | Attending: Psychiatry | Admitting: Psychiatry

## 2020-09-10 ENCOUNTER — Other Ambulatory Visit: Payer: Self-pay

## 2020-09-10 DIAGNOSIS — F17210 Nicotine dependence, cigarettes, uncomplicated: Secondary | ICD-10-CM | POA: Insufficient documentation

## 2020-09-10 DIAGNOSIS — F909 Attention-deficit hyperactivity disorder, unspecified type: Secondary | ICD-10-CM | POA: Insufficient documentation

## 2020-09-10 DIAGNOSIS — Z20822 Contact with and (suspected) exposure to covid-19: Secondary | ICD-10-CM | POA: Insufficient documentation

## 2020-09-10 DIAGNOSIS — F411 Generalized anxiety disorder: Secondary | ICD-10-CM | POA: Insufficient documentation

## 2020-09-10 DIAGNOSIS — F329 Major depressive disorder, single episode, unspecified: Secondary | ICD-10-CM | POA: Insufficient documentation

## 2020-09-10 DIAGNOSIS — R45851 Suicidal ideations: Secondary | ICD-10-CM | POA: Insufficient documentation

## 2020-09-10 DIAGNOSIS — F1590 Other stimulant use, unspecified, uncomplicated: Secondary | ICD-10-CM | POA: Insufficient documentation

## 2020-09-10 DIAGNOSIS — F1994 Other psychoactive substance use, unspecified with psychoactive substance-induced mood disorder: Secondary | ICD-10-CM | POA: Insufficient documentation

## 2020-09-10 LAB — CBC WITH DIFFERENTIAL/PLATELET
Abs Immature Granulocytes: 0.03 10*3/uL (ref 0.00–0.07)
Basophils Absolute: 0 10*3/uL (ref 0.0–0.1)
Basophils Relative: 0 %
Eosinophils Absolute: 0 10*3/uL (ref 0.0–0.5)
Eosinophils Relative: 0 %
HCT: 46.7 % (ref 39.0–52.0)
Hemoglobin: 15.8 g/dL (ref 13.0–17.0)
Immature Granulocytes: 0 %
Lymphocytes Relative: 17 %
Lymphs Abs: 1.7 10*3/uL (ref 0.7–4.0)
MCH: 29.4 pg (ref 26.0–34.0)
MCHC: 33.8 g/dL (ref 30.0–36.0)
MCV: 87 fL (ref 80.0–100.0)
Monocytes Absolute: 0.5 10*3/uL (ref 0.1–1.0)
Monocytes Relative: 5 %
Neutro Abs: 7.6 10*3/uL (ref 1.7–7.7)
Neutrophils Relative %: 78 %
Platelets: 282 10*3/uL (ref 150–400)
RBC: 5.37 MIL/uL (ref 4.22–5.81)
RDW: 11.6 % (ref 11.5–15.5)
WBC: 9.8 10*3/uL (ref 4.0–10.5)
nRBC: 0 % (ref 0.0–0.2)

## 2020-09-10 LAB — COMPREHENSIVE METABOLIC PANEL
ALT: 16 U/L (ref 0–44)
AST: 17 U/L (ref 15–41)
Albumin: 4.5 g/dL (ref 3.5–5.0)
Alkaline Phosphatase: 75 U/L (ref 38–126)
Anion gap: 14 (ref 5–15)
BUN: 8 mg/dL (ref 6–20)
CO2: 22 mmol/L (ref 22–32)
Calcium: 9.7 mg/dL (ref 8.9–10.3)
Chloride: 104 mmol/L (ref 98–111)
Creatinine, Ser: 0.88 mg/dL (ref 0.61–1.24)
GFR, Estimated: >60 mL/min (ref >60)
Glucose, Bld: 101 mg/dL — ABNORMAL HIGH (ref 70–99)
Potassium: 3.9 mmol/L (ref 3.5–5.1)
Sodium: 140 mmol/L (ref 135–145)
Total Bilirubin: 0.8 mg/dL (ref 0.3–1.2)
Total Protein: 7.7 g/dL (ref 6.5–8.1)

## 2020-09-10 LAB — LIPID PANEL
Cholesterol: 193 mg/dL (ref 0–200)
HDL: 48 mg/dL (ref >40)
LDL Cholesterol: 133 mg/dL — ABNORMAL HIGH (ref 0–99)
Total CHOL/HDL Ratio: 4 RATIO
Triglycerides: 62 mg/dL (ref <150)
VLDL: 12 mg/dL (ref 0–40)

## 2020-09-10 LAB — POC SARS CORONAVIRUS 2 AG -  ED: SARS Coronavirus 2 Ag: NEGATIVE

## 2020-09-10 LAB — RESPIRATORY PANEL BY RT PCR (FLU A&B, COVID)
Influenza A by PCR: NEGATIVE
Influenza B by PCR: NEGATIVE
SARS Coronavirus 2 by RT PCR: NEGATIVE

## 2020-09-10 LAB — TSH: TSH: 0.112 u[IU]/mL — ABNORMAL LOW (ref 0.350–4.500)

## 2020-09-10 LAB — ETHANOL: Alcohol, Ethyl (B): <10 mg/dL (ref <10)

## 2020-09-10 LAB — MAGNESIUM: Magnesium: 2.1 mg/dL (ref 1.7–2.4)

## 2020-09-10 LAB — HEMOGLOBIN A1C
Hgb A1c MFr Bld: 5.3 % (ref 4.8–5.6)
Mean Plasma Glucose: 105.41 mg/dL

## 2020-09-10 LAB — POC SARS CORONAVIRUS 2 AG: SARS Coronavirus 2 Ag: NEGATIVE

## 2020-09-10 MED ORDER — TRAZODONE HCL 50 MG PO TABS: 50.0000 mg | ORAL_TABLET | Freq: Every evening | ORAL | Status: DC | PRN

## 2020-09-10 MED ORDER — ALUM & MAG HYDROXIDE-SIMETH 200-200-20 MG/5ML PO SUSP: 30.0000 mL | ORAL | Status: DC | PRN

## 2020-09-10 MED ORDER — ACETAMINOPHEN 325 MG PO TABS: 650.0000 mg | ORAL_TABLET | Freq: Four times a day (QID) | ORAL | Status: DC | PRN

## 2020-09-10 MED ORDER — GABAPENTIN 300 MG PO CAPS
300.0000 mg | ORAL_CAPSULE | Freq: Three times a day (TID) | ORAL | Status: DC
  Administered 2020-09-10: 300 mg via ORAL
  Filled 2020-09-10: qty 1

## 2020-09-10 MED ORDER — MAGNESIUM HYDROXIDE 400 MG/5ML PO SUSP: 30.0000 mL | Freq: Every day | ORAL | Status: DC | PRN

## 2020-09-10 MED ORDER — HYDROXYZINE HCL 25 MG PO TABS: 25.0000 mg | ORAL_TABLET | Freq: Three times a day (TID) | ORAL | Status: DC | PRN

## 2020-09-10 NOTE — BH Assessment (Signed)
Assessment Note  Randy Huff is an 29 y.o. male. He presents to [facility] as a walk-in. He was transported to [facility] by his girlfriend, voluntarily. States that he is "addicted to drugs". Patient uses methamphetamine and heroin. He started using methamphetamine at the age of 29 yrs old. He started using heroin at the age of 29 yrs old. He uses 1 gram of methamphetamine daily and 1/2 gram of heroin daily. His last use for both substances was "the day before yesterday". He reports succesful attempts to quit using 1 yr ago and he stayed sober for 8-9 months. Current withdrawal symptoms reported include: "upset stomach, hands cramping, and sweating". Negative consequences in his use include relationship issues with his current girlfriend. He states that yesterday his girlfriend threatened to break up with him if he didn't get help.  He has no history of substance use treatment. States that he would like to stop using and willing to participate in a detox/residential program.  Patient reports suicidal ideations onset was yesterday. He denies a plan and/or intent to harm himself yesterday. Today, he no longer has those suicidal ideations. He is able to contract for safety. He has no history of suicide attempts and/or gestures. No history of self mutilating behaviors. Current depressive symptoms reported include: Feeling angry/irritable, Feeling worthless/self pity. He has vegetative symptoms that include difficulty getting out of the bed. Appetite is good. His sleep has decreased to 4-5 hours per night. Anxiety is severe (daily) and he has panic attacks. Patient denies a family history of mental health illness. No history of trauma and/or abuse. His support system is his girlfriend.   Patient denies HI. No history of aggressive and/or assault ive behaviors. No legal issues or criminal charges pending. No court dates reported. Denies AVH's. He does not have a current outpatient therapist and/or psychiatrist. He has a history  of #1 inpatient psychiatrist admission at [facility] in [facility], [place] at the age of 29 yrs old. The admission was due to increased depression.   Per Marciano Sequin, NP, patient meets criteria for admission to the [facility]. Patient to be transported for the [facility] for overnight observation. Peer Support consult placed to follow up with patient in the morning for detox/residential placement. Shuvon Rankin, NP, accepted patient to the [facility].   Diagnosis: Major Depressive Disorder, Severe; Anxiety Disorder; Substance Use Disorder  Past Medical History: No past medical history on file.  No past surgical history on file.  Family History:  Family History  Problem Relation Age of Onset  . Healthy Mother   . Healthy Father     Social History:  reports that he has been smoking cigarettes. He has been smoking about 1.00 pack per day. He has never used smokeless tobacco. He reports current alcohol use. He reports that he does not use drugs.  Additional Social History:  Alcohol / Drug Use Pain Medications: SEE MAR Prescriptions: SEE MAR Over the Counter: SEE MAR History of alcohol / drug use?: Yes Substance #1 Name of Substance 1: Methamphetamine 1 - Age of First Use: 29 yrs old 1 - Amount (size/oz): 1 gram 1 - Frequency: daily 1 - Duration: on-going 1 - Last Use / Amount: "day before yesterday"; 1 gram Substance #2 Name of Substance 2: Heroin 2 - Age of First Use: 29 y/o 2 - Amount (size/oz): 1/2 gram 2 - Frequency: daily 2 - Duration: on-gong 2 - Last Use / Amount: "day before yesterday"; 1/2 gram  CIWA: CIWA-Ar BP: 128/88  Pulse Rate: (!) 120 COWS:    Allergies: No Known Allergies  Home Medications: (Not in a hospital admission)   OB/GYN Status:  No LMP for male patient.  General Assessment Data Location of Assessment:  [facility] Assessment ) Is this a Tele or Face-to-Face Assessment?: Face-to-Face Is this an Initial Assessment or a Re-assessment for this encounter?:  Initial Assessment Patient Accompanied by::  (Girlfriend ) Language Other than English: No Living Arrangements: Other (Comment) (patient lives in home with girlfriend and her #3 children ) What gender do you identify as?: Male Date Telepsych consult ordered in [facility]:  (walk-in ) Marital status: Single Maiden name:  (n/a) Pregnancy Status: No Living Arrangements: Spouse/significant other, Other relatives (lives in home with girlfriend and her chiildren ) Can pt return to current living arrangement?: Yes Admission Status: Voluntary Is patient capable of signing voluntary admission?: Yes Referral Source: Self/Family/Friend Insurance type:  (Self Pay )     Crisis Care Plan Living Arrangements: Spouse/significant other, Other relatives (lives in home with girlfriend and her chiildren ) Legal Guardian:  (no legal guardian ) Name of Psychiatrist:  (no psychiatrist ) Name of Therapist:  (No therapist )  Education Status Is patient currently in school?: No Is the patient employed, unemployed or receiving disability?: Unemployed  Risk to self with the past 6 months Suicidal Ideation: No-Not Currently/Within Last 6 Months Has patient been a risk to self within the past 6 months prior to admission? : No Suicidal Intent: No Has patient had any suicidal intent within the past 6 months prior to admission? : No Is patient at risk for suicide?: No Suicidal Plan?: No Has patient had any suicidal plan within the past 6 months prior to admission? : No Access to Means: No What has been your use of drugs/alcohol within the last 12 months?:  (Meth and Heroin ) Previous Attempts/Gestures: No How many times?:  (0) Other Self Harm Risks:  (denies self harm risk ) Triggers for Past Attempts:  (no past attempts/gestures) Intentional Self Injurious Behavior: None Family Suicide History: No Recent stressful life event(s):  (drug use; "I am addicted to drugs") Persecutory voices/beliefs?: No Depression:  Yes Depression Symptoms: Feeling angry/irritable, Feeling worthless/self pity Substance abuse history and/or treatment for substance abuse?: No Suicide prevention information given to non-admitted patients: Not applicable  Risk to Others within the past 6 months Homicidal Ideation: No Does patient have any lifetime risk of violence toward others beyond the six months prior to admission? : No Thoughts of Harm to Others: No Current Homicidal Intent: No Current Homicidal Plan: No Access to Homicidal Means: No Identified Victim:  (n/a) History of harm to others?: No Assessment of Violence: None Noted Violent Behavior Description:  (currently calm and cooperative ) Does patient have access to weapons?: No Criminal Charges Pending?: No Does patient have a court date: No Is patient on probation?: No  Psychosis Hallucinations: None noted Delusions: None noted  Mental Status Report Appearance/Hygiene: Disheveled Eye Contact: Good Motor Activity: Freedom of movement Speech: Logical/coherent Level of Consciousness: Alert Mood: Depressed, Sad Affect: Appropriate to circumstance Anxiety Level: None Thought Processes: Relevant, Coherent Judgement: Impaired Orientation: Person, Place, Time, Situation Obsessive Compulsive Thoughts/Behaviors: None  Cognitive Functioning Concentration: Decreased Memory: Recent Intact, Remote Intact Is patient IDD: No Insight: Fair Impulse Control: Fair Appetite: Good Have you had any weight changes? : No Change Sleep: Decreased Total Hours of Sleep:  (4-5 hrs of sleep ) Vegetative Symptoms: Staying in bed, Decreased grooming  ADLScreening [facility] Assessment Services)  Patient's cognitive ability adequate to safely complete daily activities?: Yes Patient able to express need for assistance with ADLs?: Yes Independently performs ADLs?: No  Prior Inpatient Therapy Prior Inpatient Therapy: Yes Prior Therapy Dates:  (age 24 or 29 yrs old ) Prior  Therapy Facilty/Provider(s):  ([facility] )  Prior Outpatient Therapy Prior Outpatient Therapy: No Does patient have an ACCT team?: No Does patient have Intensive In-House Services?  : No Does patient have [facility] services? : No Does patient have P4CC services?: No  ADL Screening (condition at time of admission) Patient's cognitive ability adequate to safely complete daily activities?: Yes Patient able to express need for assistance with ADLs?: Yes Independently performs ADLs?: No       Abuse/Neglect Assessment (Assessment to be complete while patient is alone) Physical Abuse: Denies Verbal Abuse: Denies Sexual Abuse: Denies Exploitation of patient/patient's resources: Denies Self-Neglect: Denies                Disposition:  Per Marciano Sequin, NP, patient meets criteria for admission to the [facility]. Patient to be transported for the [facility] for overnight observation. Peer Support consult placed to follow up with patient in the morning for detox/residential placement. Shuvon Rankin, NP, accepted patient to the [facility].   Disposition Initial Assessment Completed for this Encounter: Yes Patient referred to:  (Referred to the [facility] (peer support ordered for placement))  On Site Evaluation by:   Reviewed with Physician:    Melynda Ripple 09/10/2020 3:13 PM

## 2020-09-10 NOTE — ED Provider Notes (Signed)
Behavioral Health Admission [facility]&P [facility] & OBS)  Date: 09/10/20 Patient Name: Randy Huff MRN: [id number] Chief Complaint: No chief complaint on file.     Diagnoses:  Final diagnoses:  Substance induced mood disorder (HCC)    HPI: Patient states "I am ready to stop using methamphetamine and heroin."  Patient reports he presented as a walk-in patient to call behavioral health today in an effort to seek substance use treatment.  Patient reports current stressor is substance use.  Patient reports his girlfriend has threatened to leave him if he does not receive substance use treatment.  Patient resides in [place] with his girlfriend.  Patient denies access to weapons.  Patient is currently unemployed, 6 months.  Patient endorses methamphetamine and heroin use, chronically.  Patient endorses last use of methamphetamine or heroin 1 was approximately 2 days ago.  Patient denies alcohol use.  Patient reports he was diagnosed with depression many years ago.  Patient denies outpatient psychiatry follow-up at this time.  Patient denies any current medications.  Patient assessed by nurse practitioner.  Patient endorses passive suicidal ideations, denies plan or intent.  Patient contracts verbally for safety with this Clinical research associate.  Patient endorses history of 1 prior suicide attempt "many years ago."  Patient denies homicidal ideations.  Patient denies auditory and visual hallucinations.  There is no evidence of delusional thought content and no indication that patient is responding to internal stimuli.  Patient denies symptoms of paranoia.  Patient endorses average sleep and appetite.  Patient pleasant cooperative during assessment, actively participates.  Patient offered support and encouragement.  PHQ 2-9:     Total Time spent with patient: 30 minutes  Musculoskeletal  Strength & Muscle Tone: within normal limits Gait & Station: normal Patient leans: N/A  Psychiatric Specialty Exam   Presentation General Appearance: Appropriate for Environment;Casual  Eye Contact:Good  Speech:Clear and Coherent;Normal Rate  Speech Volume:Normal  Handedness:Right   Mood and Affect  Mood:Depressed  Affect:Appropriate;Congruent   Thought Process  Thought Processes:Coherent;Goal Directed  Descriptions of Associations:Intact  Orientation:Full (Time, Place and Person)  Thought Content:Logical;WDL  Hallucinations:Hallucinations: None  Ideas of Reference:None  Suicidal Thoughts:Suicidal Thoughts: Yes, Passive SI Passive Intent and/or Plan: Without Plan  Homicidal Thoughts:Homicidal Thoughts: No   Sensorium  Memory:Immediate Good;Recent Good;Remote Good  Judgment:Fair  Insight:Fair   Executive Functions  Concentration:Good  Attention Span:Good  Recall:Good  Fund of Knowledge:Good  Language:Good   Psychomotor Activity  Psychomotor Activity:Psychomotor Activity: Normal   Assets  Assets:Communication Skills;Desire for Improvement;Financial Resources/Insurance;Housing;Intimacy;Resilience;Social Support   Sleep  Sleep:Sleep: Good   Physical Exam Vitals and nursing note reviewed.  Constitutional:      Appearance: He is well-developed.  HENT:     Head: Normocephalic.  Cardiovascular:     Rate and Rhythm: Normal rate.  Pulmonary:     Effort: Pulmonary effort is normal.  Neurological:     Mental Status: He is alert and oriented to person, place, and time.  Psychiatric:        Attention and Perception: Attention and perception normal.        Mood and Affect: Mood and affect normal.        Speech: Speech normal.        Behavior: Behavior normal. Behavior is cooperative.        Thought Content: Thought content includes suicidal ideation.        Cognition and Memory: Cognition and memory normal.        Judgment: Judgment normal.    Review of  Systems  Constitutional: Negative.   HENT: Negative.   Eyes: Negative.   Respiratory: Negative.    Cardiovascular: Negative.   Gastrointestinal: Negative.   Genitourinary: Negative.   Musculoskeletal: Negative.   Skin: Negative.   Neurological: Negative.   Endo/Heme/Allergies: Negative.   Psychiatric/Behavioral: Positive for substance abuse and suicidal ideas.    Blood pressure (!) 142/87, pulse (!) 120, temperature 98.7 F (37.1 C), temperature source Oral, height 6' (1.829 m), weight 183 lb (83 kg), SpO2 100 %. Body mass index is 24.82 kg/m.  Past Psychiatric History: Depression   Is the patient at risk to self? Yes  Has the patient been a risk to self in the past 6 months? Yes .    Has the patient been a risk to self within the distant past? Yes   Is the patient a risk to others? No   Has the patient been a risk to others in the past 6 months? No   Has the patient been a risk to others within the distant past? No   Past Medical History: No past medical history on file. No past surgical history on file.  Family History:  Family History  Problem Relation Age of Onset  . Healthy Mother   . Healthy Father     Social History:  Social History   Socioeconomic History  . Marital status: Legally Separated    Spouse name: Not on file  . Number of children: Not on file  . Years of education: Not on file  . Highest education level: Not on file  Occupational History  . Not on file  Tobacco Use  . Smoking status: Current Every Day Smoker    Packs/day: 1.00    Types: Cigarettes  . Smokeless tobacco: Never Used  Vaping Use  . Vaping Use: Never used  Substance and Sexual Activity  . Alcohol use: Yes  . Drug use: Never  . Sexual activity: Not on file  Other Topics Concern  . Not on file  Social History Narrative  . Not on file   Social Determinants of Health   Financial Resource Strain:   . Difficulty of Paying Living Expenses: Not on file  Food Insecurity:   . Worried About Programme researcher, broadcasting/film/video in the Last Year: Not on file  . Ran Out of Food in the Last Year:  Not on file  Transportation Needs:   . Lack of Transportation (Medical): Not on file  . Lack of Transportation (Non-Medical): Not on file  Physical Activity:   . Days of Exercise per Week: Not on file  . Minutes of Exercise per Session: Not on file  Stress:   . Feeling of Stress : Not on file  Social Connections:   . Frequency of Communication with Friends and Family: Not on file  . Frequency of Social Gatherings with Friends and Family: Not on file  . Attends Religious Services: Not on file  . Active Member of Clubs or Organizations: Not on file  . Attends Banker Meetings: Not on file  . Marital Status: Not on file  Intimate Partner Violence:   . Fear of Current or Ex-Partner: Not on file  . Emotionally Abused: Not on file  . Physically Abused: Not on file  . Sexually Abused: Not on file    SDOH:  SDOH Screenings   Alcohol Screen:   . Last Alcohol Screening Score (AUDIT): Not on file  Depression (PHQ2-9):   . PHQ-2 Score: Not on file  Financial Resource Strain:   . Difficulty of Paying Living Expenses: Not on file  Food Insecurity:   . Worried About Programme researcher, broadcasting/film/video in the Last Year: Not on file  . Ran Out of Food in the Last Year: Not on file  Housing:   . Last Housing Risk Score: Not on file  Physical Activity:   . Days of Exercise per Week: Not on file  . Minutes of Exercise per Session: Not on file  Social Connections:   . Frequency of Communication with Friends and Family: Not on file  . Frequency of Social Gatherings with Friends and Family: Not on file  . Attends Religious Services: Not on file  . Active Member of Clubs or Organizations: Not on file  . Attends Banker Meetings: Not on file  . Marital Status: Not on file  Stress:   . Feeling of Stress : Not on file  Tobacco Use: High Risk  . Smoking Tobacco Use: Current Every Day Smoker  . Smokeless Tobacco Use: Never Used  Transportation Needs:   . Freight forwarder  (Medical): Not on file  . Lack of Transportation (Non-Medical): Not on file    Last Labs:  No visits with results within 6 Month(s) from this visit.  Latest known visit with results is:  Admission on 01/03/2019, Discharged on 01/03/2019  Component Date Value Ref Range Status  . Color, Urine 01/03/2019 YELLOW  YELLOW Final  . APPearance 01/03/2019 CLOUDY* CLEAR Final  . Specific Gravity, Urine 01/03/2019 1.027  1.005 - 1.030 Final  . pH 01/03/2019 7.0  5.0 - 8.0 Final  . Glucose, UA 01/03/2019 NEGATIVE  NEGATIVE mg/dL Final  . Hgb urine dipstick 01/03/2019 NEGATIVE  NEGATIVE Final  . Bilirubin Urine 01/03/2019 NEGATIVE  NEGATIVE Final  . Ketones, ur 01/03/2019 NEGATIVE  NEGATIVE mg/dL Final  . Protein, ur 70/35/0093 NEGATIVE  NEGATIVE mg/dL Final  . Nitrite 81/82/9937 NEGATIVE  NEGATIVE Final  . Glori Luis 01/03/2019 NEGATIVE  NEGATIVE Final   Performed at [facility], [address], [place], [place] [address]    Allergies: Patient has no known allergies.  PTA Medications: (Not in a hospital admission)   Medical Decision Making  Discussed initiation of gabapentin to target withdrawal symptoms and cravings.  Discussed side effects as well as risk versus benefits.  Patient in agreement with plan.  Start gabapentin 300 mg 3 times daily. Hydroxyzine 25 mg 3 times daily as needed/anxiety Trazodone 50 mg nightly as needed/insomnia    Recommendations  Based on my evaluation the patient does not appear to have an emergency medical condition.  Patient reviewed with Dr. Nelly Rout.  Patient will be placed in the continuous assessment area at [facility] for treatment and stabilization.  Patient will be evaluated on 09/11/2020, treatment team will determine disposition at that time.  Patrcia Dolly, FNP 09/10/20  4:07 PM

## 2020-09-10 NOTE — ED Notes (Signed)
Patient belongings in locker 27 

## 2020-09-10 NOTE — ED Notes (Signed)
Pt stated didn't have to urinate at this time.

## 2020-09-10 NOTE — ED Notes (Addendum)
29 yo male admitted to continuous assessment from [facility] due to SI no plan and requesting detox from heroin and meth. Pt reports last use 1 day ago. Pt lethargic throughout assessment. Cooperative with assessment.  Pt states, "I'm not feeling suicidal. I'm just tired and need some help. I want rehab". Denies withdrawal sx at present except fatigue. Informed pt to notify staff with any needs or concerns. Will monitor for safety.

## 2020-09-10 NOTE — ED Notes (Signed)
Pt signed AMA form stating he cannot stay here, he cannot get rest.  NP Berneice Heinrich notified.  Left [facility] A&O x 4, no distress, valuables returned, pt waiting in waiting room for family.

## 2020-09-10 NOTE — H&P (Signed)
Behavioral Health Medical Screening Exam  Randy Huff is an 29 y.o. male with history of depression, anxiety, ADHD. He is presenting for substance use treatment. He reports using meth for 3 years (1 g daily) and heroin since age 72 (0.5 g daily). Last use of both two days ago. He reports SI with no plan or intent, related to his girlfriend stating she will leave with her three children if he does not stop using . He is requesting help with detox. Denies any periods of sobriety from substances since age 64. He was sober from heroin for 6 months last year but continued to use meth during that period. He is agreeable to overnight observation with detox medications and peer support consult in the morning. Denies HI/AVH.    Total Time spent with patient: 30 minutes  Psychiatric Specialty Exam: Physical Exam Vitals reviewed.  Constitutional:      Appearance: He is well-developed.  Cardiovascular:     Rate and Rhythm: Tachycardia present.  Pulmonary:     Effort: Pulmonary effort is normal.  Neurological:     Mental Status: He is alert and oriented to person, place, and time.    Review of Systems  Constitutional: Positive for diaphoresis and fatigue.  Respiratory: Negative for cough and shortness of breath.   Gastrointestinal: Positive for abdominal pain (cramping) and diarrhea. Negative for nausea and vomiting.  Musculoskeletal: Positive for myalgias.  Neurological: Positive for headaches. Negative for tremors and seizures.  Psychiatric/Behavioral: Positive for dysphoric mood and suicidal ideas. Negative for agitation, behavioral problems, confusion, decreased concentration, hallucinations, self-injury and sleep disturbance. The patient is nervous/anxious. The patient is not hyperactive.    Blood pressure 128/88, pulse (!) 120, temperature 98.2 F (36.8 C), temperature source Oral, resp. rate 18, SpO2 99 %.There is no height or weight on file to calculate BMI. General Appearance:  Disheveled Eye Contact:  Fair Speech:  Slow Volume:  Decreased Mood:  Anxious and Depressed Affect:  Congruent Thought Process:  Coherent Orientation:  Full (Time, Place, and Person) Thought Content:  Logical Suicidal Thoughts:  Yes.  without intent/plan Homicidal Thoughts:  No Memory:  Immediate;   Fair Recent;   Fair Remote;   Fair Judgement:  Intact Insight:  Fair Psychomotor Activity:  Decreased Concentration: Concentration: Fair and Attention Span: Fair Recall:  [employer] of Knowledge:Fair Language: Fair Akathisia:  No Handed:  Right AIMS (if indicated):    Assets:  Communication Skills Desire for Improvement Housing Sleep:     Musculoskeletal: Strength & Muscle Tone: within normal limits Gait & Station: normal Patient leans: N/A  Blood pressure 128/88, pulse (!) 120, temperature 98.2 F (36.8 C), temperature source Oral, resp. rate 18, SpO2 99 %.  Recommendations: Based on my evaluation the patient does not appear to have an emergency medical condition.  Overnight observation.  Aldean Baker, NP 09/10/2020, 2:52 PM

## 2020-09-11 ENCOUNTER — Ambulatory Visit (HOSPITAL_COMMUNITY)
Admission: AD | Admit: 2020-09-11 | Discharge: 2020-09-11 | Disposition: A | Discharge status: Home / Self Care | Payer: Self-pay | Attending: Psychiatry | Admitting: Psychiatry

## 2020-09-12 LAB — PROLACTIN: Prolactin: 3.5 ng/mL — ABNORMAL LOW (ref 4.0–15.2)

## 2021-08-24 DIAGNOSIS — Z0189 Encounter for other specified special examinations: Secondary | ICD-10-CM | POA: Diagnosis not present

## 2021-08-24 DIAGNOSIS — Z23 Encounter for immunization: Secondary | ICD-10-CM | POA: Diagnosis not present

## 2021-08-24 DIAGNOSIS — E669 Obesity, unspecified: Secondary | ICD-10-CM | POA: Diagnosis not present

## 2021-11-24 DIAGNOSIS — E669 Obesity, unspecified: Secondary | ICD-10-CM | POA: Diagnosis not present

## 2021-11-24 DIAGNOSIS — Z720 Tobacco use: Secondary | ICD-10-CM | POA: Diagnosis not present

## 2022-02-04 DIAGNOSIS — M25511 Pain in right shoulder: Secondary | ICD-10-CM | POA: Diagnosis not present

## 2022-02-10 DIAGNOSIS — M25511 Pain in right shoulder: Secondary | ICD-10-CM | POA: Diagnosis not present

## 2022-02-10 DIAGNOSIS — Z720 Tobacco use: Secondary | ICD-10-CM | POA: Diagnosis not present

## 2022-02-25 DIAGNOSIS — M25511 Pain in right shoulder: Secondary | ICD-10-CM | POA: Diagnosis not present

## 2022-03-17 DIAGNOSIS — M25511 Pain in right shoulder: Secondary | ICD-10-CM | POA: Diagnosis not present

## 2022-05-25 ENCOUNTER — Encounter: Payer: Self-pay | Admitting: Orthopedic Surgery

## 2022-05-25 ENCOUNTER — Ambulatory Visit (INDEPENDENT_AMBULATORY_CARE_PROVIDER_SITE_OTHER): Payer: BC Managed Care – PPO | Admitting: Orthopedic Surgery

## 2022-05-25 ENCOUNTER — Ambulatory Visit (INDEPENDENT_AMBULATORY_CARE_PROVIDER_SITE_OTHER): Payer: BC Managed Care – PPO

## 2022-05-25 DIAGNOSIS — M25511 Pain in right shoulder: Secondary | ICD-10-CM

## 2022-05-25 DIAGNOSIS — Q740 Other congenital malformations of upper limb(s), including shoulder girdle: Secondary | ICD-10-CM

## 2022-05-25 NOTE — Progress Notes (Signed)
Office Visit Note   Patient: Randy Huff           Date of Birth: [birth year]           MRN: [id number] Visit Date: 05/25/2022 Requested by: Irven Coe, MD [address] [place],  [place] [address] PCP: Irven Coe, MD  Subjective: Chief Complaint  Patient presents with   Right Shoulder - Pain    HPI: Randy Huff is a 31 year old patient with right shoulder pain.  Just started about 6 months ago.  He is right-hand dominant.  A year ago he was able to lift heavy things with no issues.  Occasionally the pain will radiate into the neck at times.  He had a steroid shot around the time of Easter for his initial symptoms.  Then was prescribed Naprosyn.  Then he went to a sports medicine doctor who did a subacromial injection.  Radiographs not performed until today.  Having difficulty sleeping.  He works in [employer] which requires 75 pound lift.  Hard for him to pick up his 76-year-old.  He is a smoker.              ROS: All systems reviewed are negative as they relate to the chief complaint within the history of present illness.  Patient denies  fevers or chills.   Assessment & Plan: Visit Diagnoses:  1. Right shoulder pain, unspecified chronicity   2. Congenital glenoid dysplasia     Plan: Impression is right shoulder glenoid dysplasia by radiographs.  Does have asymmetric external rotation and limitation of motion on the right compared to the left.  Radiographs suggest glenohumeral joint arthritis which is mild but difficult to assess based on the radiographic views obtained today which are made more difficult by the glenoid dysplasia.  Plan at this time is MRI arthrogram of the right shoulder to evaluate primarily the amount of dysplasia and the amount of arthritis in the glenohumeral joint.  It is possible he may have occult rotator cuff pathology and labral pathology in addition to this congenital problem.  Follow-up after that study.  Follow-Up Instructions: Return for after  MRI.   Orders:  Orders Placed This Encounter  Procedures   XR Shoulder Right   MR SHOULDER RIGHT W CONTRAST   Arthrogram   No orders of the defined types were placed in this encounter.     Procedures: No procedures performed   Clinical Data: No additional findings.  Objective: Vital Signs: There were no vitals taken for this visit.  Physical Exam:   Constitutional: Patient appears well-developed HEENT:  Head: Normocephalic Eyes:EOM are normal Neck: Normal range of motion Cardiovascular: Normal rate Pulmonary/chest: Effort normal Neurologic: Patient is alert Skin: Skin is warm Psychiatric: Patient has normal mood and affect   Ortho Exam: Ortho exam demonstrates full cervical spine range of motion.  5 out of 5 grip EPL FPL interosseous wrist flexion extension bicep triceps and deltoid strength.  He has passive range of motion on the left of 65/105/170.  On the right motion is 30/100/170.  Rotator cuff strength intact on the right infraspinatus demonstrates and subscap muscle testing.  No masses lymphadenopathy or skin changes noted in the shoulder girdle region.  O'Brien's testing equivocal on the right negative on the left.  No discrete AC joint tenderness is present.  No coarseness is present with passive range of motion of that right shoulder.  Specialty Comments:  No specialty comments available.  Imaging: XR Shoulder Right  Result Date:  05/25/2022 AP axillary lateral outlet radiographs right shoulder reviewed.  Patient has shortened glenoid neck.  Shoulder is located.  Degenerative changes may be present within the glenohumeral joint.  Acromiohumeral distance maintained.  No arthritis in the [facility] joint.  Visualized lung fields clear.    PMFS History: There are no problems to display for this patient.  No past medical history on file.  Family History  Problem Relation Age of Onset   Healthy Mother    Healthy Father     No past surgical history on file. Social  History   Occupational History   Not on file  Tobacco Use   Smoking status: Every Day    Packs/day: 1.00    Types: Cigarettes   Smokeless tobacco: Never  Vaping Use   Vaping Use: Never used  Substance and Sexual Activity   Alcohol use: Yes   Drug use: Never   Sexual activity: Not on file

## 2022-06-08 ENCOUNTER — Ambulatory Visit
Admission: RE | Admit: 2022-06-08 | Discharge: 2022-06-08 | Disposition: A | Discharge status: Home / Self Care | Payer: BC Managed Care – PPO | Source: Ambulatory Visit | Attending: Orthopedic Surgery | Admitting: Orthopedic Surgery

## 2022-06-08 ENCOUNTER — Ambulatory Visit
Admission: RE | Admit: 2022-06-08 | Discharge: 2022-06-08 | Disposition: A | Discharge status: Home / Self Care | Payer: BC Managed Care – PPO | Source: Ambulatory Visit

## 2022-06-08 ENCOUNTER — Other Ambulatory Visit: Payer: Self-pay | Admitting: Orthopedic Surgery

## 2022-06-08 DIAGNOSIS — S43431A Superior glenoid labrum lesion of right shoulder, initial encounter: Secondary | ICD-10-CM | POA: Diagnosis not present

## 2022-06-08 DIAGNOSIS — M25511 Pain in right shoulder: Secondary | ICD-10-CM | POA: Diagnosis not present

## 2022-06-08 DIAGNOSIS — Z135 Encounter for screening for eye and ear disorders: Secondary | ICD-10-CM | POA: Diagnosis not present

## 2022-06-08 MED ORDER — IOPAMIDOL (ISOVUE-M 200) INJECTION 41%
15.0000 mL | Freq: Once | INTRAMUSCULAR | Status: AC
  Administered 2022-06-08: 15 mL via INTRA_ARTICULAR

## 2022-06-15 ENCOUNTER — Ambulatory Visit (INDEPENDENT_AMBULATORY_CARE_PROVIDER_SITE_OTHER): Payer: BC Managed Care – PPO | Admitting: Orthopedic Surgery

## 2022-06-15 ENCOUNTER — Ambulatory Visit: Payer: Self-pay

## 2022-06-15 DIAGNOSIS — M25511 Pain in right shoulder: Secondary | ICD-10-CM

## 2022-06-15 DIAGNOSIS — M19011 Primary osteoarthritis, right shoulder: Secondary | ICD-10-CM

## 2022-06-16 ENCOUNTER — Encounter: Payer: Self-pay | Admitting: Orthopedic Surgery

## 2022-06-16 MED ORDER — LIDOCAINE HCL 1 % IJ SOLN
3.0000 mL | INTRAMUSCULAR | Status: AC | PRN
  Administered 2022-06-15: 3 mL

## 2022-06-16 MED ORDER — BUPIVACAINE HCL 0.25 % IJ SOLN
0.6600 mL | INTRAMUSCULAR | Status: AC | PRN
  Administered 2022-06-15: .66 mL via INTRA_ARTICULAR

## 2022-06-16 MED ORDER — METHYLPREDNISOLONE ACETATE 40 MG/ML IJ SUSP
13.3300 mg | INTRAMUSCULAR | Status: AC | PRN
  Administered 2022-06-15: 13.33 mg via INTRA_ARTICULAR

## 2022-06-16 NOTE — Progress Notes (Signed)
Office Visit Note   Patient: Randy Huff           Date of Birth: Mar 29, 1991           MRN: [id number] Visit Date: 06/15/2022 Requested by: Irven Coe, MD [address] [place],  [place] [address] PCP: Irven Coe, MD  Subjective: Chief Complaint  Patient presents with   Right Shoulder - Follow-up    HPI: Randy Huff is a 31 year old patient with right shoulder pain.  Since he was last seen he has had an MRI arthrogram of the shoulder.  Pain continues to wake the patient from sleep at night.  Hard to lay on the right-hand side.  The scan on my review does show SLAP tear along with severe and significant AC joint arthritis with edema both in the acromion and the distal end of the clavicle.  He also does have shortening of the glenoid neck consistent with glenoid dysplasia.  No significant arthritis in the glenohumeral joint.  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              ROS: All systems reviewed are negative as they relate to the chief complaint within the history of present illness.  Patient denies  fevers or chills.   Assessment & Plan: Visit Diagnoses:  1. Right shoulder pain, unspecified chronicity     Plan: Impression is 2 pain generators in the right shoulder.  I think he does have significant AC joint arthritis which is likely the biggest pain generator.  Also [name] has a SLAP tear which I think could also be contributing to his symptoms.  Plan at this time is [facility] joint injection under ultrasound guidance.  We will see what component of his pain this removes I think that surgery for [name] could be considered in the future which would be arthroscopy with biceps tenodesis and distal clavicle excision.  Time out of work discussed.  We will see how he is doing with this after 3 weeks when his fiance returns for clinic visit.  We can make decision at that time about surgical intervention or continued  conservative treatment..  Follow-Up Instructions: No follow-ups on file.   Orders:  Orders Placed This Encounter  Procedures   US Guided Needle Placement - No Linked Charges   No orders of the defined types were placed in this encounter.     Procedures: Medium Joint Inj: R acromioclavicular on 06/15/2022 7:08 AM Indications: diagnostic evaluation and pain Details: 25 G 1.5 in needle, ultrasound-guided superior approach Medications: 3 mL lidocaine 1 %; 0.66 mL bupivacaine 0.25 %; 13.33 mg methylPREDNISolone acetate 40 MG/ML Outcome: tolerated well, no immediate complications Procedure, treatment alternatives, risks and benefits explained, specific risks discussed. Consent was given by the patient. Immediately prior to procedure a time out was called to verify the correct patient, procedure, equipment, support staff and site/side marked as required. Patient was prepped and draped in the usual sterile fashion.       Clinical Data: No additional findings.  Objective: Vital Signs: There were no vitals taken for this visit.  Physical Exam:   Constitutional: Patient appears well-developed HEENT:  Head: Normocephalic Eyes:EOM are normal Neck: Normal range of motion Cardiovascular: Normal rate Pulmonary/chest: Effort normal Neurologic: Patient is alert Skin: Skin is warm Psychiatric: Patient has normal mood and affect   Ortho Exam: Ortho exam demonstrates tenderness to palpation of the [facility] joint on the  right compared to the left.  Positive O'Brien's testing.  Range of motion examination unchanged from prior visit.  Has excellent rotator cuff strength infraspinatus supraspinatus and subscap muscle testing.  No sensory function of the hand is intact.  Specialty Comments:  No specialty comments available.  Imaging: No results found.   PMFS History: There are no problems to display for this patient.  No past medical history on file.  Family History  Problem Relation Age of  Onset   Healthy Mother    Healthy Father     No past surgical history on file. Social History   Occupational History   Not on file  Tobacco Use   Smoking status: Every Day    Packs/day: 1.00    Types: Cigarettes   Smokeless tobacco: Never  Vaping Use   Vaping Use: Never used  Substance and Sexual Activity   Alcohol use: Yes   Drug use: Never   Sexual activity: Not on file

## 2022-07-26 IMAGING — DX DG KNEE COMPLETE 4+V*L*
4 series · 4 of 4 positions shown · non-contrast
Comparison: None.

CLINICAL DATA: Knee pain

EXAM:
LEFT KNEE - COMPLETE 4+ VIEW

[knee ap]
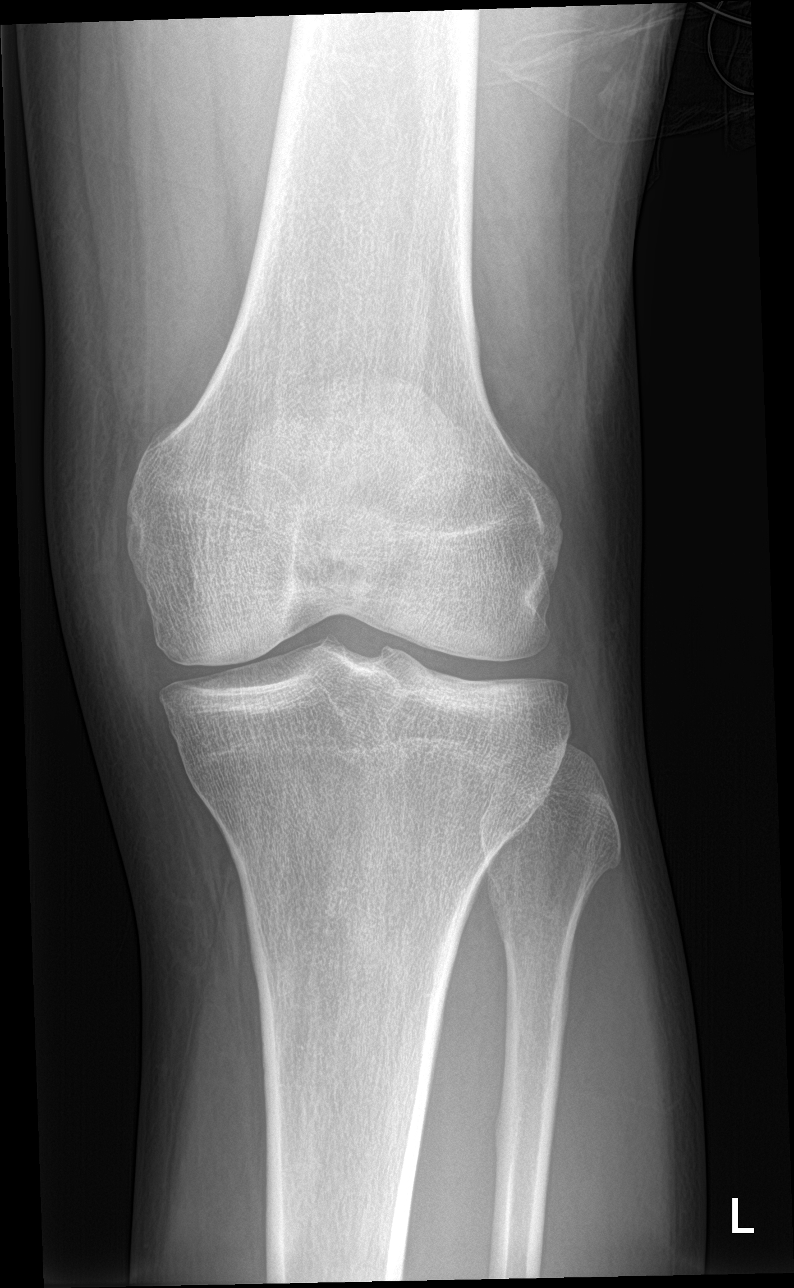

[knee obl (1 of 2)]
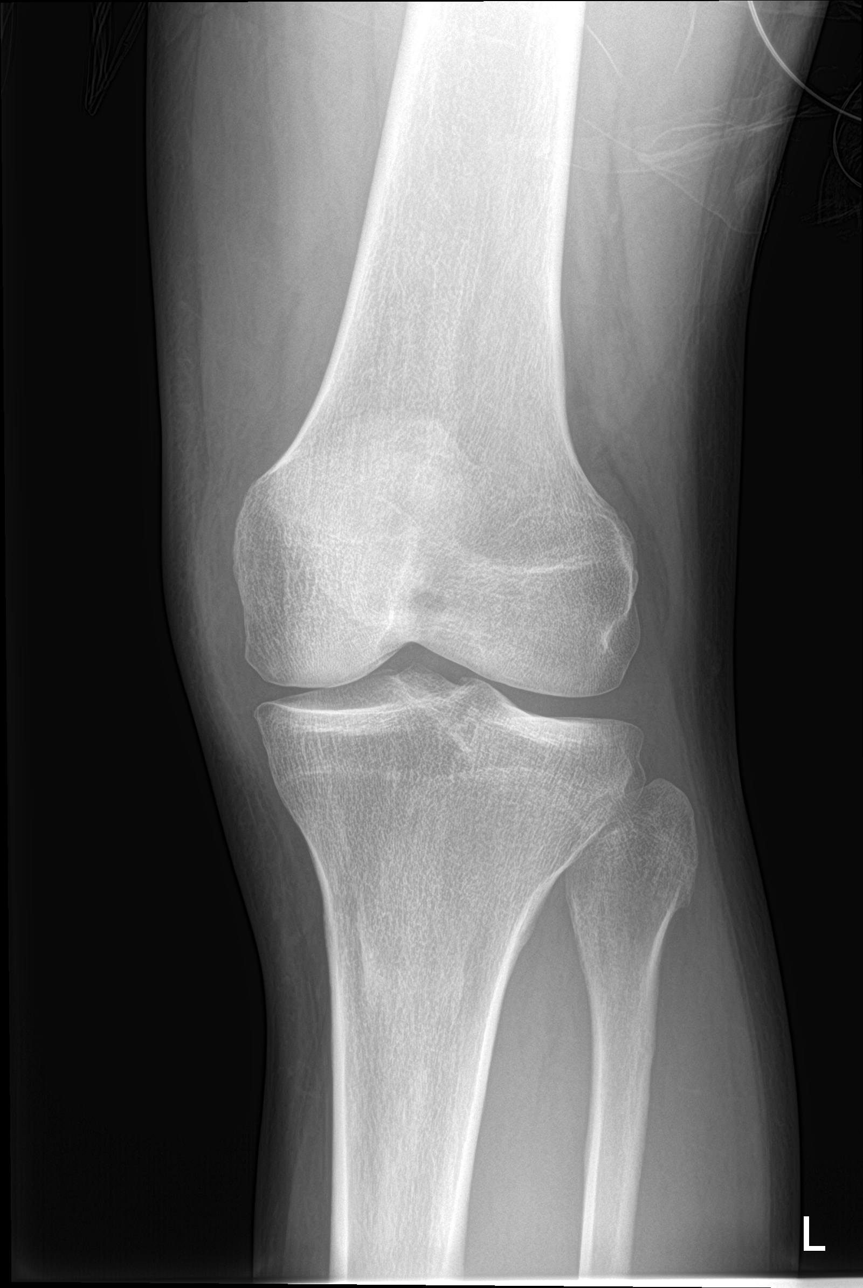

[knee obl (2 of 2)]
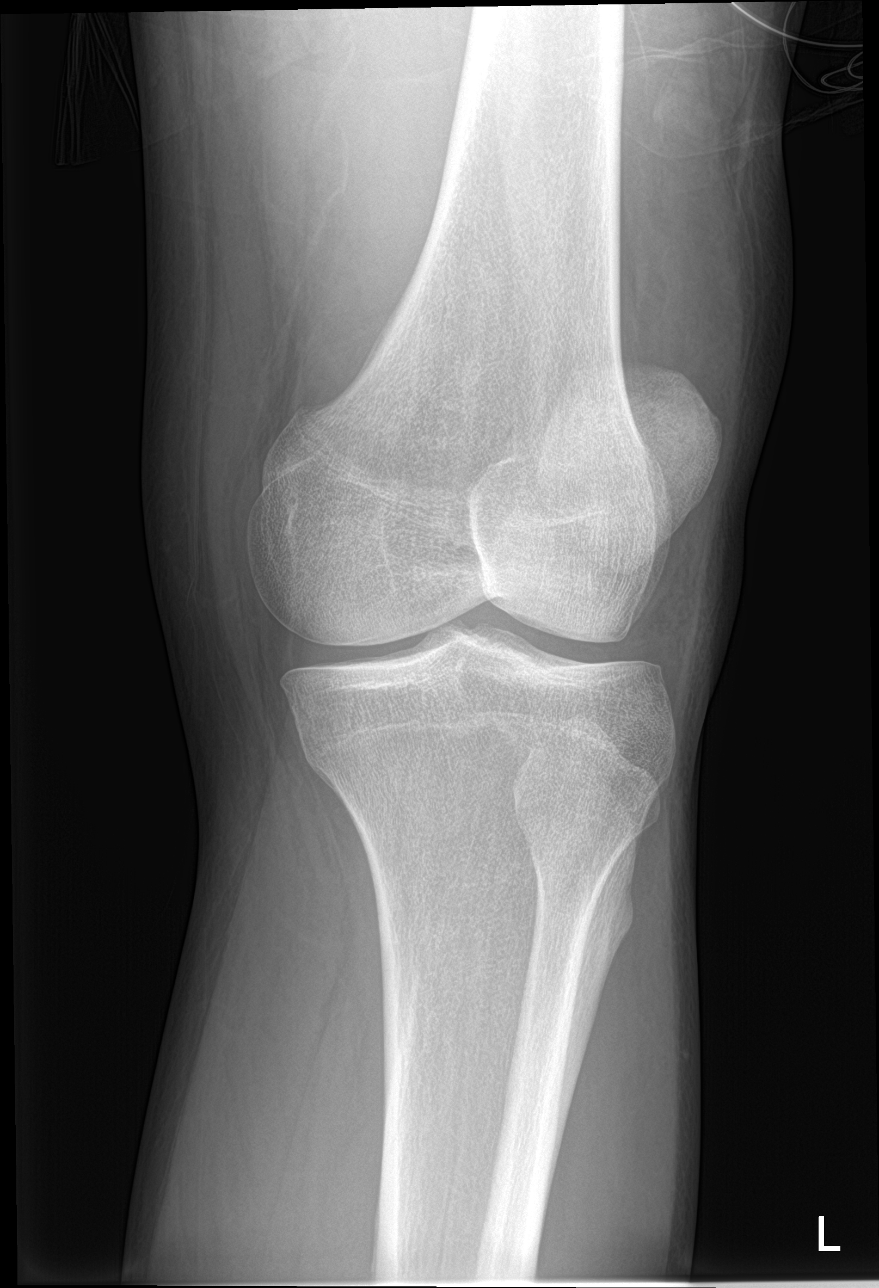

[knee lat]
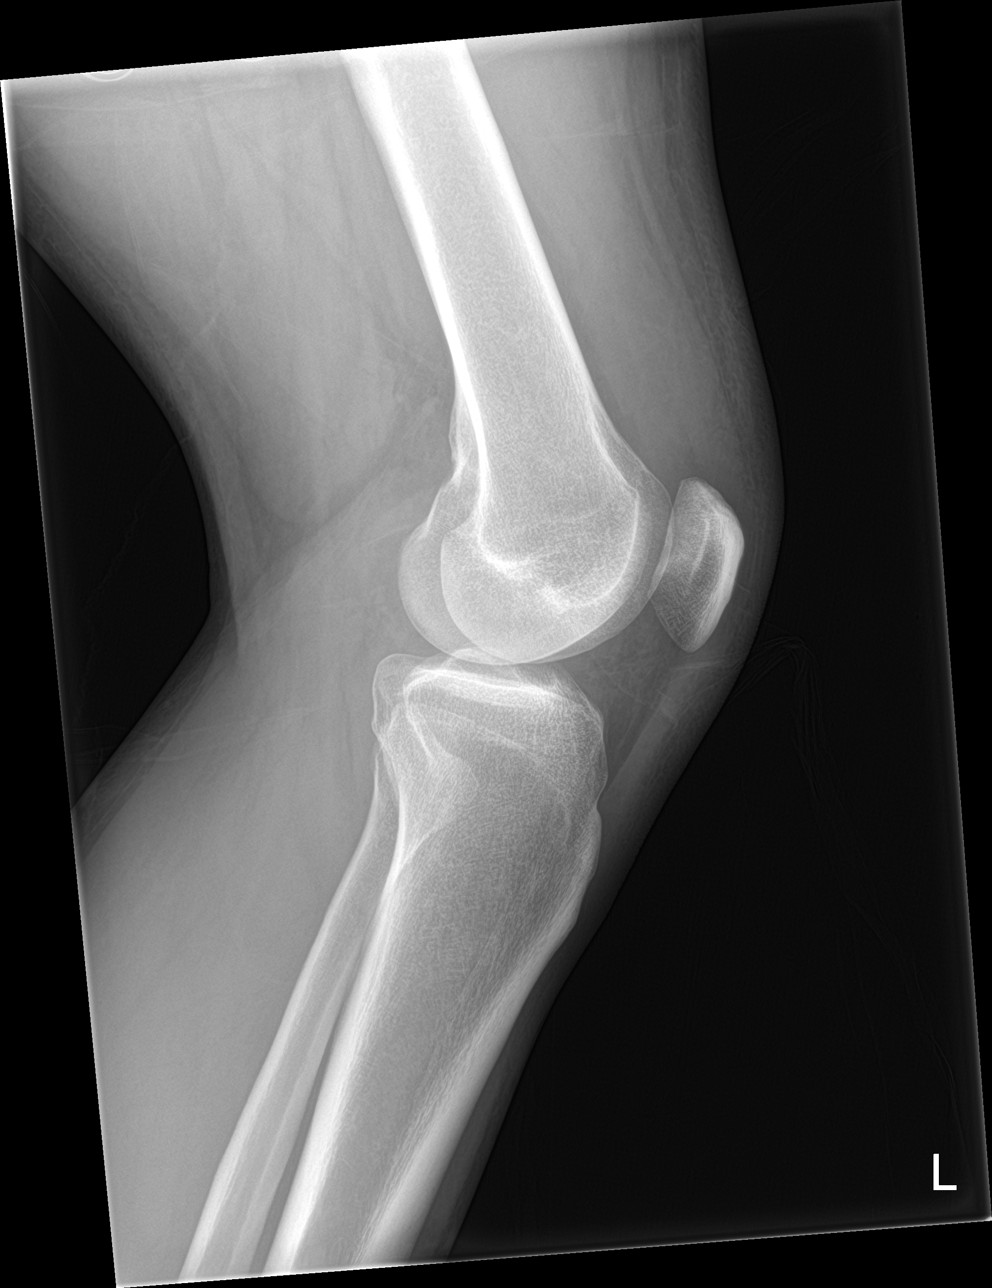

[4 of 4 positions shown; findings below may reference images not displayed]

FINDINGS: No fracture or malalignment. Possible knee effusion. The joint
spaces appear normal
IMPRESSION: No acute osseous abnormality. Possible knee effusion.

## 2022-08-17 ENCOUNTER — Ambulatory Visit (INDEPENDENT_AMBULATORY_CARE_PROVIDER_SITE_OTHER): Payer: BC Managed Care – PPO | Admitting: Orthopedic Surgery

## 2022-08-17 DIAGNOSIS — M19011 Primary osteoarthritis, right shoulder: Secondary | ICD-10-CM

## 2022-08-18 ENCOUNTER — Encounter: Payer: Self-pay | Admitting: Orthopedic Surgery

## 2022-08-18 NOTE — Progress Notes (Signed)
   Office Visit Note   Patient: Randy Huff           Date of Birth: 10/25/1991           MRN: [id number] Visit Date: 08/17/2022 Requested by: Collene Leyden, MD [address] [place],  [place] [address] PCP: Collene Leyden, MD  Subjective: Chief Complaint  Patient presents with   Right Shoulder - Pain    HPI: Randy Huff is a 31 y.o. male who presents to the office reporting right shoulder pain.  MRI scan showed significant AC joint edema.  Patient had AC joint injection 06/15/2022 and did get very good relief for slightly over 1 month without any issues.  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  He states that after the injection he was able to sleep on the right-hand side.              ROS: All systems reviewed are negative as they relate to the chief complaint within the history of present illness.  Patient denies fevers or chills.  Assessment & Plan: Visit Diagnoses:  1. Arthritis of right acromioclavicular joint     Plan: Impression is right shoulder AC joint arthritis improved significantly with an injection.  I think he wants to avoid surgery if possible.  We will consider another injection if his symptoms worsen.  Randy Huff thinks that not doing the cone work could likely allow his shoulder to continue to improve.  If not we would consider 1 final injection prior to doing any type of arthroscopic distal clavicle excision.  Mobic sent in for his symptoms.  Follow-up as needed. Follow-Up Instructions: No follow-ups on file.   Orders:  No orders of the defined types were placed in this encounter.  No orders of the defined types were placed in this encounter.     Procedures: No procedures performed   Clinical Data: No additional findings.  Objective: Vital Signs: There were no vitals taken for this visit.  Physical Exam:  Constitutional: Patient appears well-developed HEENT:  Head:  Normocephalic Eyes:EOM are normal Neck: Normal range of motion Cardiovascular: Normal rate Pulmonary/chest: Effort normal Neurologic: Patient is alert Skin: Skin is warm Psychiatric: Patient has normal mood and affect  Ortho Exam: Ortho exam demonstrates minimal tenderness to the [facility] joint on the right-hand side.  Excellent rotator cuff strength infraspinatus extremity subscap muscle testing.  Minimal crepitus with internal/external rotation at 90 degrees of abduction.  Motor or sensory function to the hand is intact.  Cervical spine range of motion is full.  Specialty Comments:  No specialty comments available.  Imaging: No results found.   PMFS History: There are no problems to display for this patient.  History reviewed. No pertinent past medical history.  Family History  Problem Relation Age of Onset   Healthy Mother    Healthy Father     History reviewed. No pertinent surgical history. Social History   Occupational History   Not on file  Tobacco Use   Smoking status: Every Day    Packs/day: 1.00    Types: Cigarettes   Smokeless tobacco: Never  Vaping Use   Vaping Use: Never used  Substance and Sexual Activity   Alcohol use: Yes   Drug use: Never   Sexual activity: Not on file

## 2022-08-21 MED ORDER — MELOXICAM 15 MG PO TABS: 15.0000 mg | ORAL_TABLET | Freq: Every day | ORAL | 1 refills | Status: AC

## 2022-10-12 ENCOUNTER — Ambulatory Visit: Payer: Self-pay

## 2022-10-12 ENCOUNTER — Ambulatory Visit (INDEPENDENT_AMBULATORY_CARE_PROVIDER_SITE_OTHER): Payer: BC Managed Care – PPO | Admitting: Orthopedic Surgery

## 2022-10-12 DIAGNOSIS — M19011 Primary osteoarthritis, right shoulder: Secondary | ICD-10-CM

## 2022-10-15 ENCOUNTER — Encounter: Payer: Self-pay | Admitting: Orthopedic Surgery

## 2022-10-15 MED ORDER — LIDOCAINE HCL 1 % IJ SOLN
3.0000 mL | INTRAMUSCULAR | Status: AC | PRN
  Administered 2022-10-12: 3 mL

## 2022-10-15 MED ORDER — BUPIVACAINE HCL 0.25 % IJ SOLN
0.6600 mL | INTRAMUSCULAR | Status: AC | PRN
  Administered 2022-10-12: .66 mL via INTRA_ARTICULAR

## 2022-10-15 MED ORDER — METHYLPREDNISOLONE ACETATE 40 MG/ML IJ SUSP
13.3300 mg | INTRAMUSCULAR | Status: AC | PRN
  Administered 2022-10-12: 13.33 mg via INTRA_ARTICULAR

## 2022-10-15 NOTE — Progress Notes (Signed)
Office Visit Note   Patient: Randy Huff           Date of Birth: 07-14-1991           MRN: [id number] Visit Date: 10/12/2022 Requested by: Irven Coe, MD [address] [place],  [place] [address] PCP: Irven Coe, MD  Subjective: Chief Complaint  Patient presents with   Right Shoulder - Pain    HPI: Randy Huff is a 31 y.o. male who presents to the office reporting right shoulder pain right lateral area is a 08/18/2024.  AC joint injection performed at that time.  Gave him about a month of relief.  His job has since changed him to become slightly less physical.  Patient denies any radicular symptoms or neck pain.  He is considering surgical intervention for [facility] joint resection sometime next year..                ROS: All systems reviewed are negative as they relate to the chief complaint within the history of present illness.  Patient denies fevers or chills.  Assessment & Plan: Visit Diagnoses:  1. Arthritis of right acromioclavicular joint     Plan: Impression is right shoulder symptomatic AC joint arthritis.  [facility] joint injection performed today under ultrasound guidance.  He will continue to modify his work activity and use topical anti-inflammatory as well as episodic oral anti-inflammatory as needed.  He will call if he wants to schedule any intervention in January.  Follow-Up Instructions: No follow-ups on file.   Orders:  Orders Placed This Encounter  Procedures   US Guided Needle Placement - No Linked Charges   No orders of the defined types were placed in this encounter.     Procedures: Medium Joint Inj: R acromioclavicular on 10/12/2022 2:19 PM Indications: pain and diagnostic evaluation Details: 27 G 1.5 in needle, ultrasound-guided superior approach Medications: 13.33 mg methylPREDNISolone acetate 40 MG/ML; 0.66 mL bupivacaine 0.25 %; 3 mL lidocaine 1 % Outcome: tolerated well, no immediate complications Procedure, treatment alternatives,  risks and benefits explained, specific risks discussed. Consent was given by the patient. Immediately prior to procedure a time out was called to verify the correct patient, procedure, equipment, support staff and site/side marked as required. Patient was prepped and draped in the usual sterile fashion.       Clinical Data: No additional findings.  Objective: Vital Signs: There were no vitals taken for this visit.  Physical Exam:  Constitutional: Patient appears well-developed HEENT:  Head: Normocephalic Eyes:EOM are normal Neck: Normal range of motion Cardiovascular: Normal rate Pulmonary/chest: Effort normal Neurologic: Patient is alert Skin: Skin is warm Psychiatric: Patient has normal mood and affect  Ortho Exam: Ortho exam demonstrates tenderness to [facility] joint palpation on the right compared to the left.  Rotator cuff strength intact infraspinatus supraspinatus subscap muscle testing bilaterally.  No masses lymphadenopathy or skin changes noted in that shoulder girdle region.  Does not have really much in the way of crepitus except for some mild popping of the [facility] joint with movement particularly crossarm adduction.  Specialty Comments:  No specialty comments available.  Imaging: No results found.   PMFS History: There are no problems to display for this patient.  History reviewed. No pertinent past medical history.  Family History  Problem Relation Age of Onset   Healthy Mother    Healthy Father     History reviewed. No pertinent surgical history. Social History   Occupational History   Not  on file  Tobacco Use   Smoking status: Every Day    Packs/day: 1.00    Types: Cigarettes   Smokeless tobacco: Never  Vaping Use   Vaping Use: Never used  Substance and Sexual Activity   Alcohol use: Yes   Drug use: Never   Sexual activity: Not on file

## 2022-12-09 DIAGNOSIS — N50812 Left testicular pain: Secondary | ICD-10-CM | POA: Diagnosis not present

## 2022-12-26 DIAGNOSIS — Z1159 Encounter for screening for other viral diseases: Secondary | ICD-10-CM | POA: Diagnosis not present

## 2022-12-26 DIAGNOSIS — Z0000 Encounter for general adult medical examination without abnormal findings: Secondary | ICD-10-CM | POA: Diagnosis not present

## 2022-12-26 DIAGNOSIS — E669 Obesity, unspecified: Secondary | ICD-10-CM | POA: Diagnosis not present

## 2022-12-26 DIAGNOSIS — E785 Hyperlipidemia, unspecified: Secondary | ICD-10-CM | POA: Diagnosis not present

## 2023-01-23 DIAGNOSIS — R7401 Elevation of levels of liver transaminase levels: Secondary | ICD-10-CM | POA: Diagnosis not present

## 2023-03-16 ENCOUNTER — Other Ambulatory Visit: Payer: Self-pay

## 2023-03-16 ENCOUNTER — Encounter (HOSPITAL_COMMUNITY): Payer: Self-pay

## 2023-03-16 ENCOUNTER — Emergency Department (HOSPITAL_COMMUNITY)
Admission: EM | Admit: 2023-03-16 | Discharge: 2023-03-16 | Discharge status: Against Medical Advice | Payer: BC Managed Care – PPO | Attending: Emergency Medicine | Admitting: Emergency Medicine

## 2023-03-16 DIAGNOSIS — K30 Functional dyspepsia: Secondary | ICD-10-CM | POA: Insufficient documentation

## 2023-03-16 DIAGNOSIS — Z5321 Procedure and treatment not carried out due to patient leaving prior to being seen by health care provider: Secondary | ICD-10-CM | POA: Insufficient documentation

## 2023-03-16 LAB — CBC
HCT: 44.6 % (ref 39.0–52.0)
Hemoglobin: 15.3 g/dL (ref 13.0–17.0)
MCH: 30.3 pg (ref 26.0–34.0)
MCHC: 34.3 g/dL (ref 30.0–36.0)
MCV: 88.3 fL (ref 80.0–100.0)
Platelets: 245 10*3/uL (ref 150–400)
RBC: 5.05 MIL/uL (ref 4.22–5.81)
RDW: 11.6 % (ref 11.5–15.5)
WBC: 7.3 10*3/uL (ref 4.0–10.5)
nRBC: 0 % (ref 0.0–0.2)

## 2023-03-16 LAB — COMPREHENSIVE METABOLIC PANEL
ALT: 92 U/L — ABNORMAL HIGH (ref 0–44)
AST: 37 U/L (ref 15–41)
Albumin: 3.7 g/dL (ref 3.5–5.0)
Alkaline Phosphatase: 75 U/L (ref 38–126)
Anion gap: 9 (ref 5–15)
BUN: 12 mg/dL (ref 6–20)
CO2: 23 mmol/L (ref 22–32)
Calcium: 9.4 mg/dL (ref 8.9–10.3)
Chloride: 107 mmol/L (ref 98–111)
Creatinine, Ser: 0.87 mg/dL (ref 0.61–1.24)
GFR, Estimated: >60 mL/min (ref >60)
Glucose, Bld: 83 mg/dL (ref 70–99)
Potassium: 3.9 mmol/L (ref 3.5–5.1)
Sodium: 139 mmol/L (ref 135–145)
Total Bilirubin: 0.5 mg/dL (ref 0.3–1.2)
Total Protein: 6.6 g/dL (ref 6.5–8.1)

## 2023-03-16 LAB — TYPE AND SCREEN
ABO/RH(D): A POS
Antibody Screen: NEGATIVE

## 2023-03-16 NOTE — ED Triage Notes (Signed)
Pt came in via POV d/t rectal bleeding that he noticed last night. States that when he sat down to have a BM he could not go so when he wiped the paper had light red  blood with clots on it. A/Ox4, denies any pain. Does endorse indigestion & last BM was yesterday around lunch time.

## 2023-03-16 NOTE — ED Notes (Signed)
Called 3 x no answer

## 2023-03-16 NOTE — ED Notes (Addendum)
Called name 3x no answer 

## 2023-03-16 NOTE — ED Notes (Signed)
Called name 3x no answer 

## 2023-03-20 DIAGNOSIS — R197 Diarrhea, unspecified: Secondary | ICD-10-CM | POA: Diagnosis not present

## 2023-03-20 DIAGNOSIS — R748 Abnormal levels of other serum enzymes: Secondary | ICD-10-CM | POA: Diagnosis not present

## 2023-03-20 DIAGNOSIS — K625 Hemorrhage of anus and rectum: Secondary | ICD-10-CM | POA: Diagnosis not present

## 2023-03-20 DIAGNOSIS — K589 Irritable bowel syndrome without diarrhea: Secondary | ICD-10-CM | POA: Diagnosis not present

## 2023-12-28 DIAGNOSIS — R195 Other fecal abnormalities: Secondary | ICD-10-CM | POA: Diagnosis not present

## 2023-12-28 DIAGNOSIS — R7401 Elevation of levels of liver transaminase levels: Secondary | ICD-10-CM | POA: Diagnosis not present

## 2023-12-28 DIAGNOSIS — E785 Hyperlipidemia, unspecified: Secondary | ICD-10-CM | POA: Diagnosis not present

## 2023-12-28 DIAGNOSIS — E669 Obesity, unspecified: Secondary | ICD-10-CM | POA: Diagnosis not present

## 2023-12-28 DIAGNOSIS — K589 Irritable bowel syndrome without diarrhea: Secondary | ICD-10-CM | POA: Diagnosis not present

## 2023-12-28 DIAGNOSIS — Z0000 Encounter for general adult medical examination without abnormal findings: Secondary | ICD-10-CM | POA: Diagnosis not present

## 2023-12-28 DIAGNOSIS — Z720 Tobacco use: Secondary | ICD-10-CM | POA: Diagnosis not present

## 2024-02-09 DIAGNOSIS — R748 Abnormal levels of other serum enzymes: Secondary | ICD-10-CM | POA: Diagnosis not present

## 2024-02-09 DIAGNOSIS — R945 Abnormal results of liver function studies: Secondary | ICD-10-CM | POA: Diagnosis not present

## 2024-05-15 DIAGNOSIS — Z6839 Body mass index (BMI) 39.0-39.9, adult: Secondary | ICD-10-CM | POA: Diagnosis not present

## 2024-05-15 DIAGNOSIS — E669 Obesity, unspecified: Secondary | ICD-10-CM | POA: Diagnosis not present

## 2024-05-15 DIAGNOSIS — I10 Essential (primary) hypertension: Secondary | ICD-10-CM | POA: Diagnosis not present

## 2024-05-15 DIAGNOSIS — S9031XA Contusion of right foot, initial encounter: Secondary | ICD-10-CM | POA: Diagnosis not present

## 2024-07-10 ENCOUNTER — Ambulatory Visit (INDEPENDENT_AMBULATORY_CARE_PROVIDER_SITE_OTHER): Admitting: Podiatry

## 2024-07-10 DIAGNOSIS — Z87891 Personal history of nicotine dependence: Secondary | ICD-10-CM | POA: Insufficient documentation

## 2024-07-10 DIAGNOSIS — K589 Irritable bowel syndrome without diarrhea: Secondary | ICD-10-CM | POA: Insufficient documentation

## 2024-07-10 DIAGNOSIS — L600 Ingrowing nail: Secondary | ICD-10-CM | POA: Diagnosis not present

## 2024-07-10 DIAGNOSIS — F1911 Other psychoactive substance abuse, in remission: Secondary | ICD-10-CM | POA: Insufficient documentation

## 2024-07-10 DIAGNOSIS — Z8659 Personal history of other mental and behavioral disorders: Secondary | ICD-10-CM | POA: Insufficient documentation

## 2024-07-10 DIAGNOSIS — R7401 Elevation of levels of liver transaminase levels: Secondary | ICD-10-CM | POA: Insufficient documentation

## 2024-07-10 DIAGNOSIS — F1520 Other stimulant dependence, uncomplicated: Secondary | ICD-10-CM | POA: Insufficient documentation

## 2024-07-10 DIAGNOSIS — K760 Fatty (change of) liver, not elsewhere classified: Secondary | ICD-10-CM | POA: Insufficient documentation

## 2024-07-10 DIAGNOSIS — E785 Hyperlipidemia, unspecified: Secondary | ICD-10-CM | POA: Insufficient documentation

## 2024-07-10 DIAGNOSIS — R195 Other fecal abnormalities: Secondary | ICD-10-CM | POA: Insufficient documentation

## 2024-07-10 DIAGNOSIS — E669 Obesity, unspecified: Secondary | ICD-10-CM | POA: Insufficient documentation

## 2024-07-10 MED ORDER — NEOMYCIN-POLYMYXIN-HC 3.5-10000-1 OT SOLN: OTIC | 0 refills | Status: AC

## 2024-07-10 NOTE — Patient Instructions (Signed)

## 2024-07-10 NOTE — Progress Notes (Signed)
  Subjective:  Patient ID: Randy Huff, male    DOB: May 07, 1991,  MRN: [id number] HPI Chief Complaint  Patient presents with   Ingrown Toenail    New pt- constant ingrown toe nails    33 y.o. male presents with the above complaint.   ROS: Denies fever chills nausea muscle aches pains calf pain back pain chest pain shortness of breath.  No past medical history on file. No past surgical history on file.  Current Outpatient Medications:    neomycin -polymyxin-hydrocortisone (CORTISPORIN) OTIC solution, 1-2 drops to toe twice daily after soaking, Disp: 10 mL, Rfl: 0   acetaminophen  (TYLENOL ) 500 MG tablet, Take 1,000 mg by mouth every 6 (six) hours as needed for mild pain or moderate pain., Disp: , Rfl:    erythromycin  ophthalmic ointment, Place a 1/2 inch ribbon of ointment into right the lower eyelid daily, Disp: 3.5 g, Rfl: 0   meloxicam  (MOBIC ) 15 MG tablet, Take 1 tablet (15 mg total) by mouth daily., Disp: 30 tablet, Rfl: 1  Allergies  Allergen Reactions   Iodinated Contrast Media Hives   Iodine Hives   Review of Systems Objective:  There were no vitals filed for this visit.  General: Well developed, nourished, in no acute distress, alert and oriented x3   Dermatological: Skin is warm, dry and supple bilateral. Nails x 10 are well maintained; remaining integument appears unremarkable at this time. There are no open sores, no preulcerative lesions, no rash or signs of infection present.  Sharp incurvated nail margin along the tibial border of the hallux left with mild erythema tenderness on palpation no purulence no malodor.  Vascular: Dorsalis Pedis artery and Posterior Tibial artery pedal pulses are 2/4 bilateral with immedate capillary fill time. Pedal hair growth present. No varicosities and no lower extremity edema present bilateral.   Neruologic: Grossly intact via light touch bilateral. Vibratory intact via tuning fork bilateral. Protective threshold with Semmes Wienstein  monofilament intact to all pedal sites bilateral. Patellar and Achilles deep tendon reflexes 2+ bilateral. No Babinski or clonus noted bilateral.   Musculoskeletal: No gross boney pedal deformities bilateral. No pain, crepitus, or limitation noted with foot and ankle range of motion bilateral. Muscular strength 5/5 in all groups tested bilateral.  Gait: Unassisted, Nonantalgic.    Radiographs:  None taken  Assessment & Plan:   Assessment: Ingrown toenail tibial border hallux left  Plan: Chemical matricectomy was performed today after local anesthetic was administered.  He tolerated the procedure well.  After he was prepped and draped in normal sterile fashion the nail was split along the tibial border from distal to proximal avulsing the portion of the nail exposing the root and the nailbed.  Phenol was then applied to the exposed root and nailbed 30 seconds each x 4 applications and then isopropyl alcohol was applied to neutralize the acid.  Silvadene cream Telfa pad and dressed a compressive dressing was provided he was given both oral and written Boostrix  for the care and soaking of the toe starting tomorrow he will also receive a prescription for Cortisporin Otic to be applied twice daily after soaking.     Emmely Bittinger T. Dunlevy, [place]

## 2024-08-05 ENCOUNTER — Ambulatory Visit: Admitting: Podiatrist

## 2024-08-28 ENCOUNTER — Ambulatory Visit: Admitting: Podiatry
